# Patient Record
Sex: Male | Born: 1980 | Race: Black or African American | Hispanic: No | Marital: Single | Smoking: Never smoker
Health system: Southern US, Community
[De-identification: ages and names within clinical notes are randomized; demographics above are authoritative.]

## PROBLEM LIST (undated history)

## (undated) DIAGNOSIS — I10 Essential (primary) hypertension: Secondary | ICD-10-CM

## (undated) HISTORY — DX: Essential (primary) hypertension: I10

## (undated) HISTORY — PX: NO PAST SURGERIES: SHX2092

---

## 2002-12-18 ENCOUNTER — Emergency Department (HOSPITAL_COMMUNITY): Admission: EM | Admit: 2002-12-18 | Discharge: 2002-12-18 | Payer: Self-pay | Admitting: Emergency Medicine

## 2002-12-18 ENCOUNTER — Encounter: Payer: Self-pay | Admitting: Emergency Medicine

## 2004-05-12 ENCOUNTER — Emergency Department (HOSPITAL_COMMUNITY): Admission: EM | Admit: 2004-05-12 | Discharge: 2004-05-13 | Payer: Self-pay | Admitting: Emergency Medicine

## 2004-11-07 ENCOUNTER — Emergency Department (HOSPITAL_COMMUNITY): Admission: EM | Admit: 2004-11-07 | Discharge: 2004-11-07 | Payer: Self-pay | Admitting: Emergency Medicine

## 2004-11-10 ENCOUNTER — Emergency Department (HOSPITAL_COMMUNITY): Admission: EM | Admit: 2004-11-10 | Discharge: 2004-11-10 | Payer: Self-pay | Admitting: Emergency Medicine

## 2005-01-12 ENCOUNTER — Emergency Department (HOSPITAL_COMMUNITY): Admission: EM | Admit: 2005-01-12 | Discharge: 2005-01-12 | Payer: Self-pay | Admitting: Emergency Medicine

## 2005-01-31 ENCOUNTER — Emergency Department (HOSPITAL_COMMUNITY): Admission: EM | Admit: 2005-01-31 | Discharge: 2005-01-31 | Payer: Self-pay | Admitting: Emergency Medicine

## 2005-06-05 ENCOUNTER — Emergency Department (HOSPITAL_COMMUNITY): Admission: EM | Admit: 2005-06-05 | Discharge: 2005-06-05 | Payer: Self-pay | Admitting: Emergency Medicine

## 2008-11-17 ENCOUNTER — Ambulatory Visit: Payer: Self-pay | Admitting: Cardiology

## 2008-11-18 ENCOUNTER — Inpatient Hospital Stay (HOSPITAL_COMMUNITY): Admission: EM | Admit: 2008-11-18 | Discharge: 2008-11-19 | Payer: Self-pay | Admitting: Emergency Medicine

## 2008-11-18 ENCOUNTER — Encounter: Payer: Self-pay | Admitting: Cardiovascular Disease

## 2008-11-19 ENCOUNTER — Encounter: Payer: Self-pay | Admitting: Cardiology

## 2008-11-20 ENCOUNTER — Ambulatory Visit: Payer: Self-pay | Admitting: Cardiovascular Disease

## 2008-12-22 ENCOUNTER — Ambulatory Visit: Payer: Self-pay | Admitting: Cardiovascular Disease

## 2009-01-07 ENCOUNTER — Ambulatory Visit: Payer: Self-pay | Admitting: Internal Medicine

## 2009-01-18 DIAGNOSIS — I498 Other specified cardiac arrhythmias: Secondary | ICD-10-CM | POA: Insufficient documentation

## 2009-06-07 ENCOUNTER — Emergency Department (HOSPITAL_COMMUNITY): Admission: EM | Admit: 2009-06-07 | Discharge: 2009-06-07 | Payer: Self-pay | Admitting: Emergency Medicine

## 2009-08-23 ENCOUNTER — Encounter (INDEPENDENT_AMBULATORY_CARE_PROVIDER_SITE_OTHER): Payer: Self-pay | Admitting: *Deleted

## 2010-04-14 ENCOUNTER — Emergency Department (HOSPITAL_COMMUNITY): Admission: EM | Admit: 2010-04-14 | Discharge: 2010-04-14 | Payer: Self-pay | Admitting: Emergency Medicine

## 2010-11-17 ENCOUNTER — Emergency Department (HOSPITAL_COMMUNITY): Admission: EM | Admit: 2010-11-17 | Discharge: 2009-12-26 | Payer: Self-pay | Admitting: Emergency Medicine

## 2011-02-26 LAB — DIFFERENTIAL
Basophils Absolute: 0 10*3/uL (ref 0.0–0.1)
Eosinophils Absolute: 0.2 10*3/uL (ref 0.0–0.7)
Eosinophils Relative: 5 % (ref 0–5)
Lymphs Abs: 1.7 10*3/uL (ref 0.7–4.0)
Neutro Abs: 1.6 10*3/uL — ABNORMAL LOW (ref 1.7–7.7)

## 2011-02-26 LAB — POCT CARDIAC MARKERS
CKMB, poc: <1 ng/mL — ABNORMAL LOW (ref 1.0–8.0)
Troponin i, poc: <0.05 ng/mL (ref 0.00–0.09)

## 2011-02-26 LAB — CBC
HCT: 41.5 % (ref 39.0–52.0)
Hemoglobin: 13.6 g/dL (ref 13.0–17.0)
MCV: 86.4 fL (ref 78.0–100.0)

## 2011-03-13 ENCOUNTER — Emergency Department (HOSPITAL_COMMUNITY)
Admission: EM | Admit: 2011-03-13 | Discharge: 2011-03-13 | Disposition: A | Discharge status: Home / Self Care | Payer: Self-pay | Attending: Emergency Medicine | Admitting: Emergency Medicine

## 2011-03-13 DIAGNOSIS — W261XXA Contact with sword or dagger, initial encounter: Secondary | ICD-10-CM | POA: Insufficient documentation

## 2011-03-13 DIAGNOSIS — Y929 Unspecified place or not applicable: Secondary | ICD-10-CM | POA: Insufficient documentation

## 2011-03-13 DIAGNOSIS — W260XXA Contact with knife, initial encounter: Secondary | ICD-10-CM | POA: Insufficient documentation

## 2011-03-13 DIAGNOSIS — S61209A Unspecified open wound of unspecified finger without damage to nail, initial encounter: Secondary | ICD-10-CM | POA: Insufficient documentation

## 2011-03-13 DIAGNOSIS — I498 Other specified cardiac arrhythmias: Secondary | ICD-10-CM | POA: Insufficient documentation

## 2011-03-13 DIAGNOSIS — R42 Dizziness and giddiness: Secondary | ICD-10-CM | POA: Insufficient documentation

## 2011-04-25 NOTE — Letter (Signed)
January 07, 2009    [name] [name] Erickson. Ronald Seltzer, MD  [address]  [place], [place] [address]   RE:  [name], [name] Erickson  MRN:  [id number]  /  DOB:  February 28, 1981   Dear [name] [name] Erickson,   It was my pleasure to see your patient, [name] [name] Erickson in  electrophysiology consultation today regarding symptomatic bradycardia.  As you recall, [name] [name] Erickson is a very pleasant 30 year old African American  gentleman with longstanding symptoms of fatigue.  He presented to [facility] in early December with symptoms of presyncope and chest  pain.  While on telemetry, he was noted to have sinus bradycardia with  heart rates as low as 37 beats per minute.  Exercise treadmill was  performed, which revealed heart rates up to 150 beats per minute with  normal perfusion imaging.  An echocardiogram revealed normal left  ventricular size and function.  A event monitor was placed December 11,  through December 10, 2008, which revealed a blunted heart rate response  to activities.  His average heart rate was 59 beats per minute with a  heart rate range of 35-125 beats per minute.  The majority of his heart  rates were between 50 and 75 beats per minute during symptoms of  headache and dizziness.  On December 23, at 6:21 p.m., he was found to  have a heart rate of 37 beats per minute.   The patient reports longstanding symptoms of fatigue, which have been  present for years.  He reports that these symptoms have increased in  their significance over the past year.  Presently, he reports becoming  washed out with moderate activities and finds himself having to lie  Erickson.  He reports that by the time he has his kids ready for school and  has done his morning activities that he is completely exhausted by 9  a.m.  He reports occasional lightheadedness as well as frontal headaches  approximately one time per week.  He also notes occasional orthostatic  dizziness.  The patient reports having a workup obtained at [facility] several  months ago, at which time, pacemaker implantation was considered.  He  has not followed up with [facility] since his recent hospitalization at [facility]  He denies presyncope or syncope and is otherwise without  complaint today.   PAST MEDICAL HISTORY:  1. Chronic fatigue.  2. Symptomatic bradycardia (as above).  3. Hypertension.   SURGERIES:  None.   SOCIAL HISTORY:  The patient lives in [place] with his spouse and two  children.  He is currently unemployed due to difficulties maintaining  activities due to fatigue.  He denies tobacco, alcohol, or drug use.   FAMILY HISTORY:  The patient is adopted.  He recently contacted his  mother who reports that she has heart problems .  He is unaware of any  other significant family history.   REVIEW OF SYSTEMS:  All systems are reviewed and negative except as  outlined in the HPI above.   ALLERGIES:  No known drug allergies.   CURRENT MEDICATIONS:  Lisinopril 5 mg daily.   PHYSICAL EXAMINATION:  VITALS:  Blood pressure 120/70, heart rate 48,  respirations 18, weight 163 pounds.  Six-minute walk was performed with  an increase in heart rate to 90 beats per minute.  GENERAL:  The patient is a thin well-appearing gentleman in no acute  distress.  He is alert and oriented x3.  HEENT:  Normocephalic, atraumatic.  Sclerae clear.  Conjunctivae  pink.  Oropharynx clear.  NECK:  Supple.  No thyromegaly, JVD, lymphadenopathy, or bruits.  LUNGS:  Clear to auscultation bilaterally.  HEART:  Regular rate and rhythm.  No murmurs, rubs, or gallops.  GASTROINTESTINAL:  Soft, nontender, nondistended.  Positive bowel  sounds.  EXTREMITIES:  No clubbing, cyanosis or edema.  NEUROLOGIC:  Cranial nerves II through XII are intact.  Strength and  sensation are intact.  SKIN:  No ecchymosis or lacerations.  MUSCULOSKELETAL:  No deformity or atrophy.  PSYCHIATRIC:  Euthymic mood, full affect.   EKG from December 10, reveals sinus rhythm with a  first-degree AV block  and a PR duration of 204 milliseconds.  LVH is also noted.   Transthoracic echocardiogram December 9, reveals a left ventricular end-  diastolic dimension of 47, IVS 66.4, posterior wall 11.4, LAD 34.  The  right and left ventricle were normal in size and function.  There was no  significant valvular disease.   GXT Myoview November 19, 2008, reveals an ejection fraction of 59% with  no evidence of ischemia.   LABORATORY DATA:  TSH 0.803.   IMPRESSION:  [name] [name] Erickson is a pleasant 30 year old gentleman with chronic  fatigue and symptomatic bradycardia who presents today for EP  consultation regarding therapeutic strategies for his symptomatic  bradycardia.  The patient's bradycardia may be secondary to sinus node  dysfunction or the blunted heart rate response due to vagal innervations  of the heart.  I think that a reasonable approach at reversible causes  of symptomatic bradycardia has been performed already.  His thyroid  profile is normal.  I think that we shall obtain a cosyntropin stim test  to rule out adrenal insufficiency, though this would seem unlikely in  the setting of hypertension.  He is on no medications which might be  contributing to his bradycardia.  We will attempt to obtain records from  [facility] where the patient was recently evaluated and pacemaker implantation  was considered.  I would like to defer permanent pacing as a last resort  strategy though this may become necessary as the patient is quite  symptomatic with fatigue and decreased exercise tolerance.  At the  present time, we will start the patient on aminophylline 100 mg twice  daily to promote tachycardia.  If he is unable to tolerate this  medication or has further symptomatic bradycardia, then we might also  consider cilostazol 50 mg twice daily.   PLAN:  1. Obtain records from [facility].  2. Aminophylline 100 mg twice daily.  3. Cosyntropin stimulation test.  4. Followup with me in  clinic in 3 weeks to assess for improvement      with aminophylline.   [name] [name] Erickson, thank you for the opportunity of participating in the care of Mr.  [name] Erickson.  Please feel free to contact me if you wish to discuss his care  further.    Sincerely,      Hillis Range, MD  Electronically Signed   JA/MedQ  DD: 01/07/2009  DT: 01/08/2009  Job #: [id number]

## 2011-04-25 NOTE — Discharge Summary (Signed)
[name], [name] NO.:  [id number]   MEDICAL RECORD NO.:  [id number]          PATIENT TYPE:  INP   LOCATION:  1408                         FACILITY:  [facility]   PHYSICIAN:  Veverly Fells. Excell Seltzer, MD  DATE OF BIRTH:  1981/04/29   DATE OF ADMISSION:  11/17/2008  DATE OF DISCHARGE:  11/19/2008                               DISCHARGE SUMMARY   PRIMARY CARDIOLOGIST:  Veverly Fells. Excell Seltzer, MD   DISCHARGE DIAGNOSIS:  Chest pain.   SECONDARY DIAGNOSES:  1. Asymptomatic bradycardia.  2. Hypertension   ALLERGIES:  No known drug allergies.   PROCEDURES:  Exercise Myoview stress testing revealing EF 59% without  any evidence of ischemia.   HISTORY OF PRESENT ILLNESS:  A 30 year old [place] American male with  prior history of bradycardia and hypertension, previously followed in  [place].  He was in his usual state of health on the evening of  December 9, when he had sudden onset of substernal chest pressure with  the sensation of heart thumping in his chest associated with dyspnea.  He presented to the [facility] ED where his pain was found to be worse  with inspiration.  He was hypokalemic in the ED with a potassium of 3.3  and this was supplemented.  His EKG showed sinus bradycardia at a rate  of 37 beats per minute with LVH, but no acute ST-T changes.  He was  admitted for further evaluation.   HOSPITAL COURSE:  The patient ruled out for MI.  A 2-D echocardiogram  was performed on December 9, showing an EF of 60% without regional wall  motion abnormalities.  He then underwent exercise Myoview this morning  showing an EF of 59% without any evidence of ischemia.  The patient has  had no recurrent chest discomfort.  On the monitor, his heart rates were  noted to be in the 30s during the morning hours, however, the rates  would slowly increased throughout the day into the 70s and 80s.  He did  show good chronotropic competence during the exercise portion of his  stress test and was able to achieve his goal heart rate.  We have  arranged for him to pick up a 30-day event recorder from our office  tomorrow morning, December 11, at 9:00 a.m..  The patient is being  discharged home today in good condition.   DISCHARGE LABORATORY DATA:  Hemoglobin 15.0, hematocrit 44.0, WBC 5.3,  and platelets 173.  Sodium 140, potassium 3.9, chloride 104, CO2 of 30,  BUN 11, creatinine 0.90, glucose 90, calcium 9.3, CK 122, MB 1.0, and  troponin I 0.01.  TSH 0.803.   DISPOSITION:  The patient is being discharged home today in good  condition.   FOLLOWUP PLANS AND APPOINTMENTS:  As noted above.  He will pick up a 30-  day event recorder from our office on December 11, at 9 a.m.  He is to  follow up with Dr. Tonny Bollman on December 28, 2008, at 12:15 p.m.   DISCHARGE MEDICATIONS:  Lisinopril 5 mg daily.   OUTSTANDING LABORATORY STUDIES:  None.  Duration of discharge encounter was 40 minutes including physician time.      Nicolasa Ducking, ANP      Veverly Fells. Excell Seltzer, MD  Electronically Signed    CB/MEDQ  D:  11/19/2008  T:  11/20/2008  Job:  [id number]

## 2011-04-25 NOTE — Assessment & Plan Note (Signed)
[facility]                            CARDIOLOGY OFFICE NOTE   NAME:[name], [name]                         MRN:          [id number]  DATE:12/22/2008                            DOB:          [birth year]    REASON FOR VISIT:  Bradycardia.   HISTORY OF PRESENT ILLNESS:  [name] is a very nice 30 year old  African American gentleman who was hospitalized in December with  symptomatic bradycardia.  [name] presented with chest pain and  heart palpitations associated with dyspnea.  He complained of  generalized fatigue.  His chest pain was highly atypical.  We were  consulted for evaluation of chest pain and bradycardia.  When I  initially saw him in consultation, his resting heart rate was  approximately 40 beats per minute in sinus rhythm.  I arranged for him  to go from [place] to [facility] following day for an exercise  treadmill stress test to assess for chronotropic incompetence.  However,  Ronald following morning, his resting heart rate had changed to  approximately 80 beats per minute.  He underwent an exercise treadmill  study that was unremarkable.  His heart rate response was normal and he  actually underwent a perfusion imaging that was also normal.  Ronald  Erickson had an echo done that showed normal left ventricular size and  systolic function.  There was no evidence of structural heart disease.  He underwent event recording, which showed that most of Ronald time his  heart rate was between 50 and 75 beats per minute; however, there were  episodes of marked sinus bradycardia similar to that seen in Ronald  hospital.   An evaluation today, [name] continues to complain of profound  fatigue.  He denies further chest pain or dyspnea.  He has lightheaded  spells that occur with standing.  He denies frank syncope.  He denies  edema.  He continues to have occasional palpitations.   MEDICATIONS:  Lisinopril 5 mg daily.   ALLERGIES:   NKDA.   PHYSICAL EXAMINATION:  GENERAL:  [name] is alert and oriented.  He  is a thin, healthy-appearing African American gentleman.  VITAL SIGNS:  Weight is 164 pounds, blood pressure 130/70, heart rate is  44, and respiratory rate is 12.  HEENT:  Normal.  NECK:  Normal carotid upstrokes.  No bruits.  JVP is normal.  LUNGS:  Clear.  HEART:  Shows normal apical impulse.  Heart sounds are bradycardic and  regular without murmurs or gallops.  ABDOMEN:  Soft, thin, and nontender.  No organomegaly.  EXTREMITIES:  No clubbing, cyanosis, or edema.  Peripheral pulses are  intact and equal.   ASSESSMENT:  There is a 30 year old gentleman with periodic sinus  bradycardia.  It is difficult to know how much of a role his bradycardia  is playing in his generalized fatigue.  He clearly is symptomatic with  fatigue and episodic lightheadedness.  I have some concerns about  placing a pacemaker in such a young gentleman.  However, I think it is  certainly worthwhile to have an  EP evaluation, and he will be referred  for consideration of permanent pacing.  Further plans pending that  evaluation.  There were no changes made in his medical regimen today.     Veverly Fells. Excell Seltzer, MD  Electronically Signed    MDC/MedQ  DD: 12/22/2008  DT: 12/23/2008  Job #: [id number]

## 2011-08-14 IMAGING — CR DG CHEST 2V
2 series · 2 of 2 positions shown · non-contrast
Comparison: 11/17/2008.

CLINICAL DATA: 29-year-old male with chest pain, hypertension.

CHEST - 2 VIEW

[w chest pa]
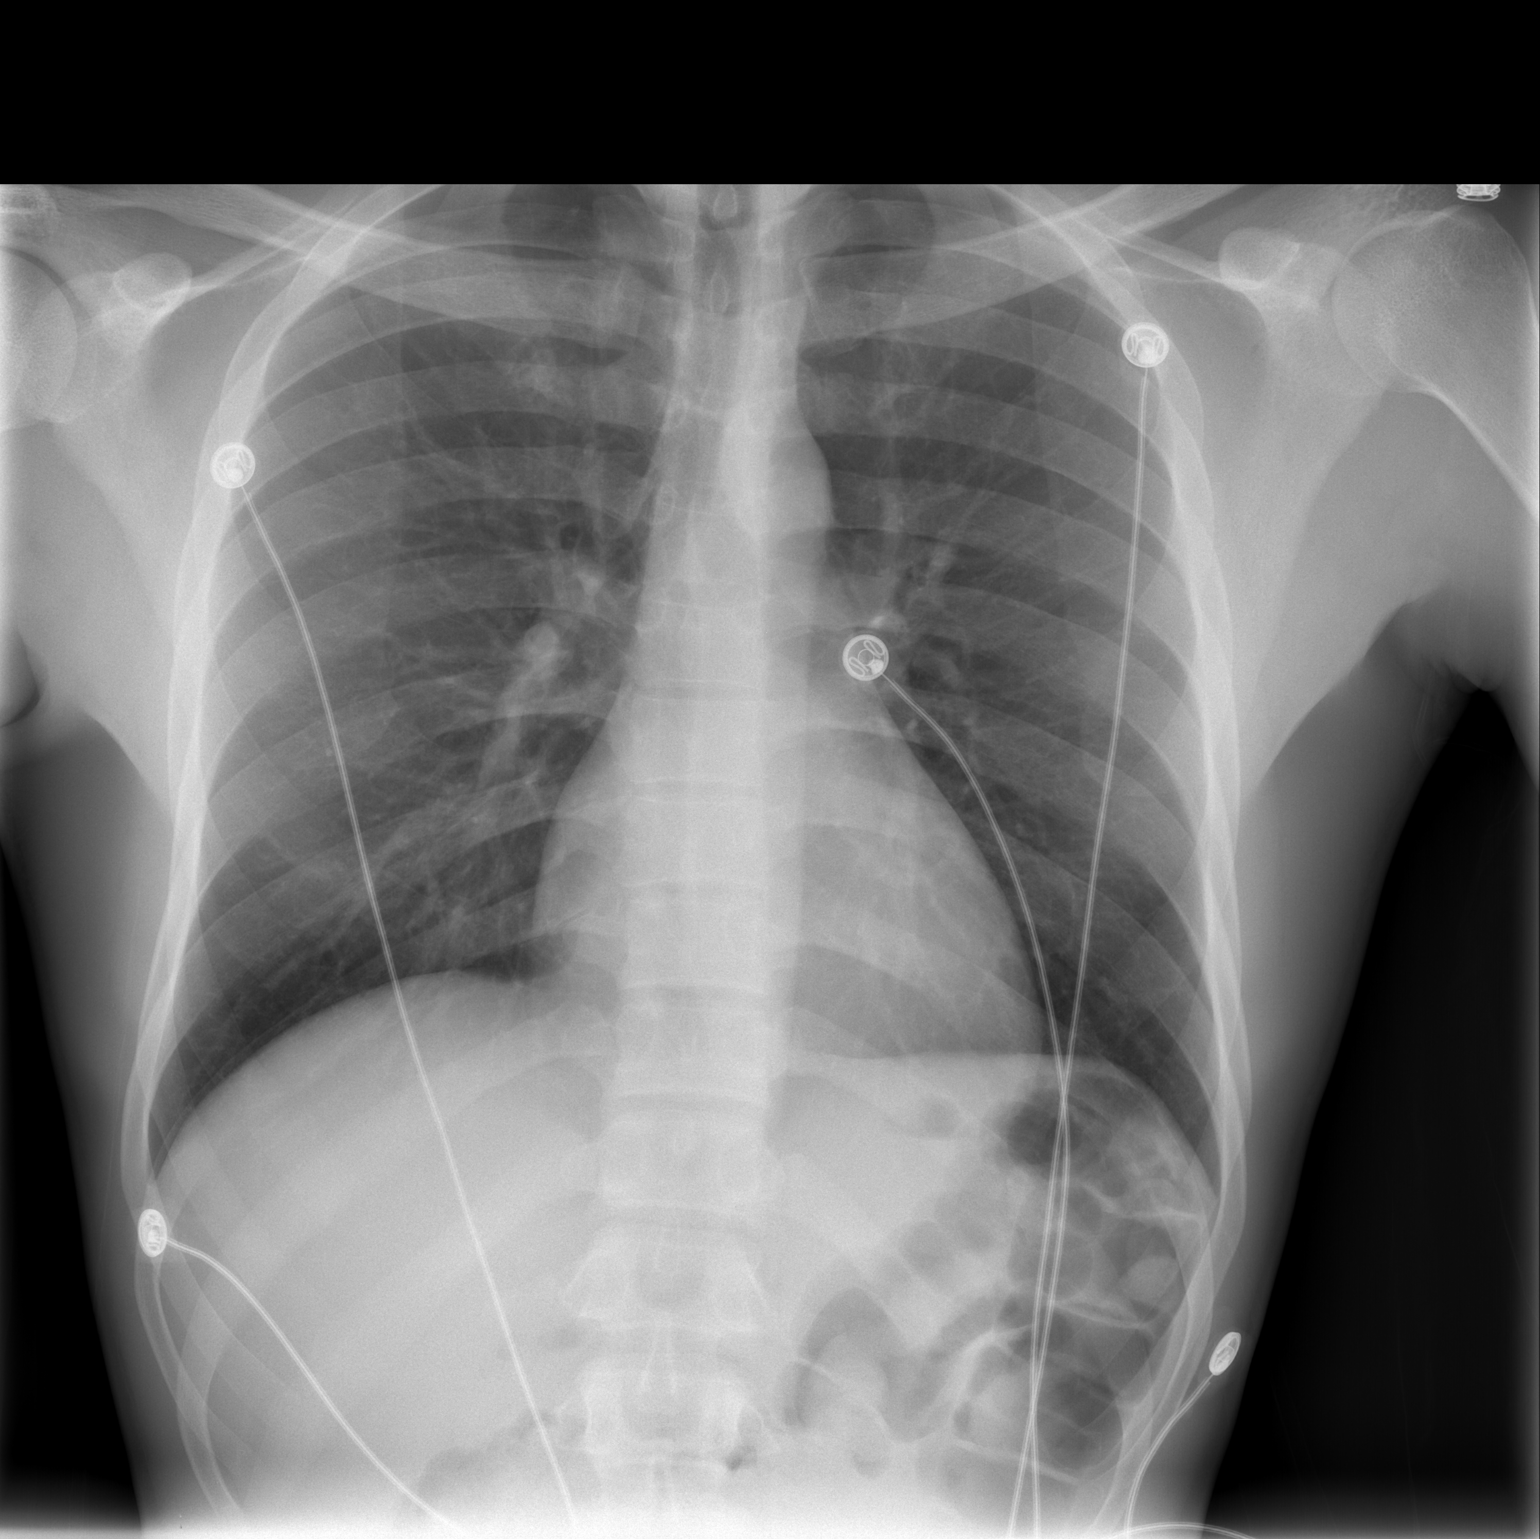

[w chest lat]
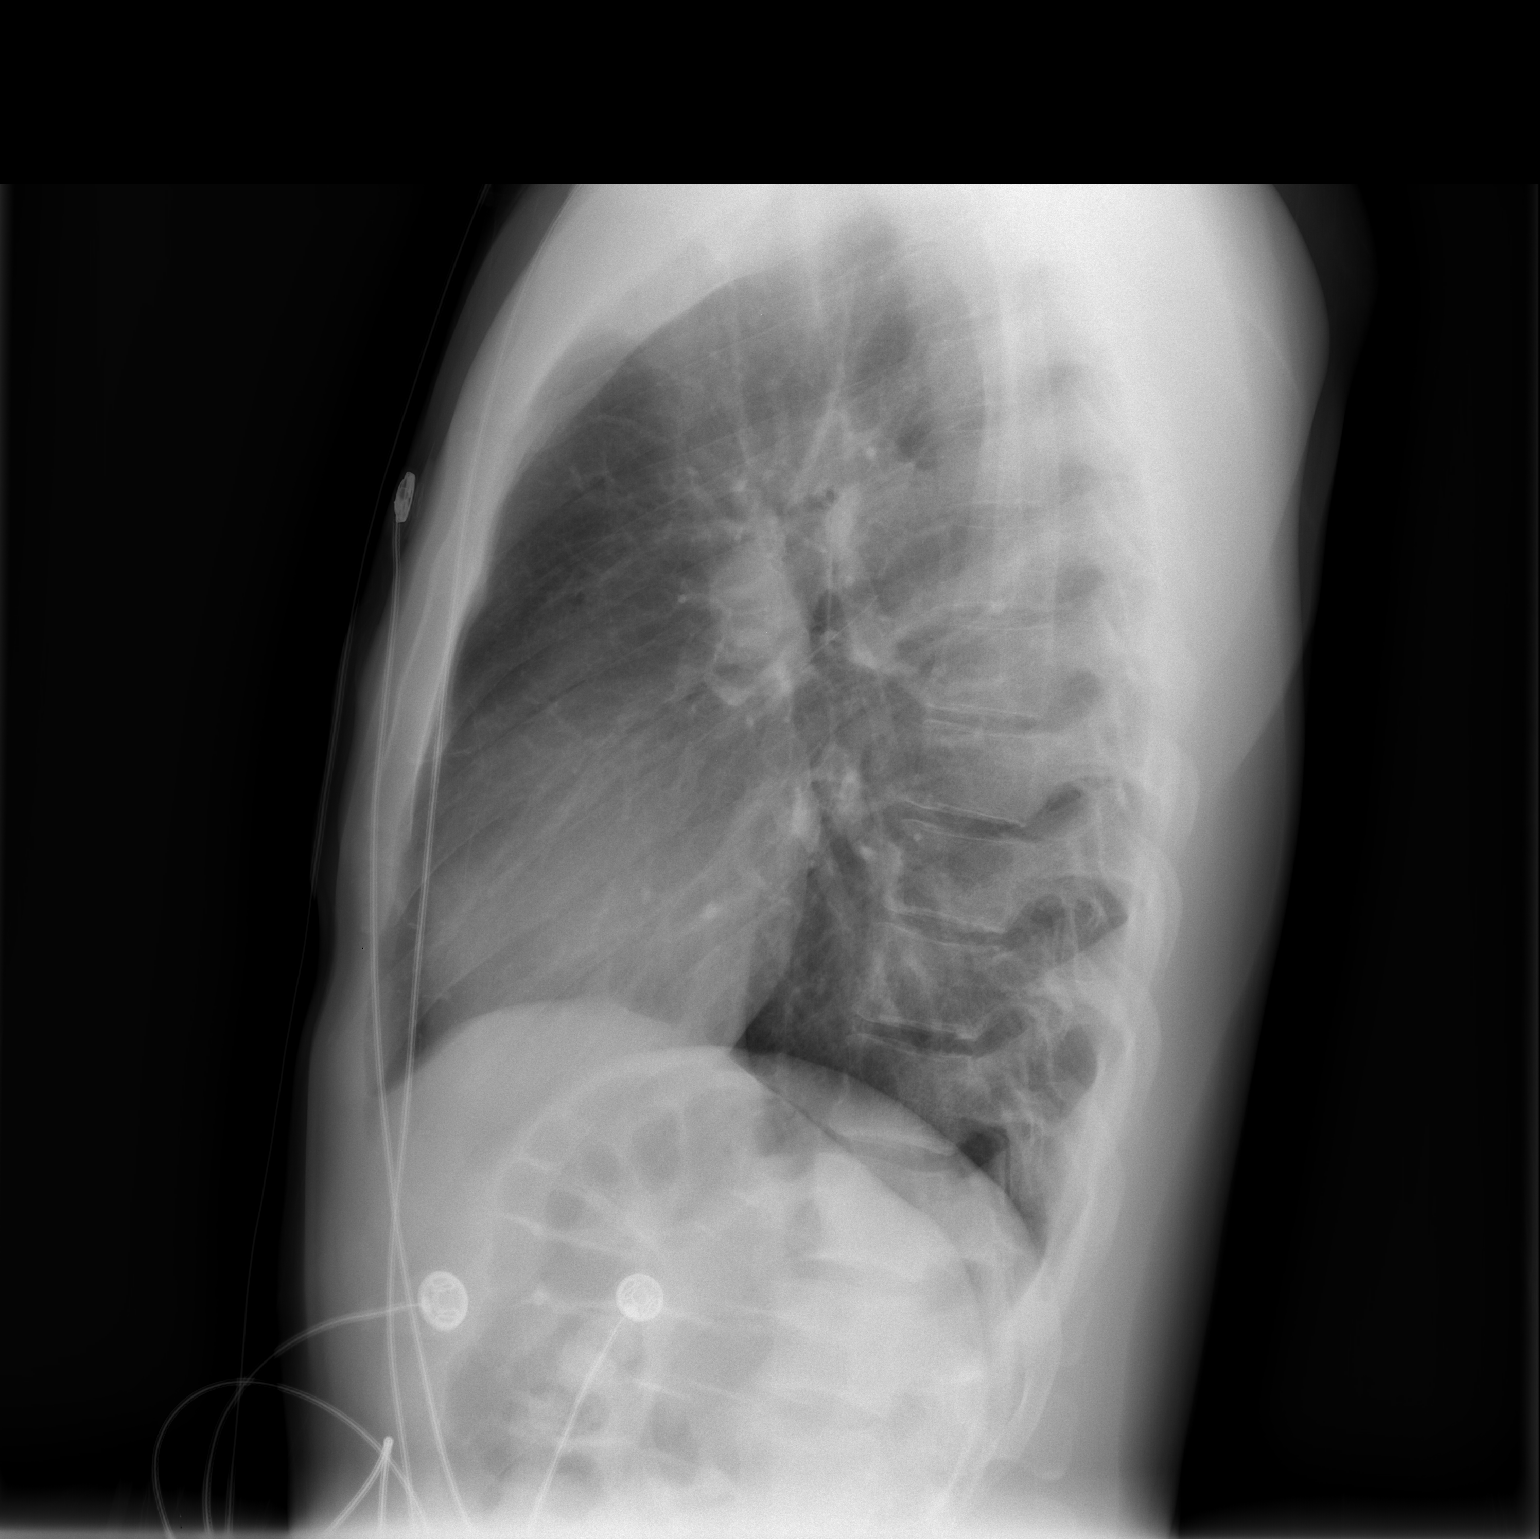

[2 of 2 positions shown; findings below may reference images not displayed]

FINDINGS: Lung volumes are normal.  Cardiac size and mediastinal
contours are within normal limits.  Visualized tracheal air column
is within normal limits.  The lungs remain clear.  No pneumothorax
or effusion. No acute osseous abnormality identified.
IMPRESSION: No acute cardiopulmonary abnormality.

## 2011-09-15 LAB — TSH: TSH: 0.803 u[IU]/mL (ref 0.350–4.500)

## 2011-09-15 LAB — POCT I-STAT, CHEM 8
BUN: 14 mg/dL (ref 6–23)
Creatinine, Ser: 1 mg/dL (ref 0.4–1.5)
Glucose, Bld: 93 mg/dL (ref 70–99)
TCO2: 27 mmol/L (ref 0–100)

## 2011-09-15 LAB — POCT CARDIAC MARKERS
CKMB, poc: <1 ng/mL — ABNORMAL LOW (ref 1.0–8.0)
Myoglobin, poc: 32.8 ng/mL (ref 12–200)
Troponin i, poc: <0.05 ng/mL (ref 0.00–0.09)
Troponin i, poc: <0.05 ng/mL (ref 0.00–0.09)

## 2011-09-15 LAB — CARDIAC PANEL(CRET KIN+CKTOT+MB+TROPI)
CK, MB: 0.8 ng/mL (ref 0.3–4.0)
Relative Index: 0.5 (ref 0.0–2.5)
Total CK: 121 U/L (ref 7–232)
Total CK: 148 U/L (ref 7–232)
Troponin I: 0.02 ng/mL (ref 0.00–0.06)

## 2011-09-15 LAB — BASIC METABOLIC PANEL
CO2: 30 mEq/L (ref 19–32)
Chloride: 104 mEq/L (ref 96–112)

## 2011-09-15 LAB — DIFFERENTIAL
Eosinophils Absolute: 0.3 10*3/uL (ref 0.0–0.7)
Monocytes Absolute: 0.5 10*3/uL (ref 0.1–1.0)
Neutro Abs: 2.9 10*3/uL (ref 1.7–7.7)

## 2011-09-15 LAB — CBC
HCT: 44.2 % (ref 39.0–52.0)
Hemoglobin: 14.2 g/dL (ref 13.0–17.0)
MCHC: 32.1 g/dL (ref 30.0–36.0)
WBC: 5.3 10*3/uL (ref 4.0–10.5)
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
## 2015-03-04 ENCOUNTER — Emergency Department (HOSPITAL_COMMUNITY)
Admission: EM | Admit: 2015-03-04 | Discharge: 2015-03-05 | Disposition: A | Discharge status: Home / Self Care | Payer: Self-pay | Attending: Emergency Medicine | Admitting: Emergency Medicine

## 2015-03-04 ENCOUNTER — Encounter (HOSPITAL_COMMUNITY): Payer: Self-pay | Admitting: *Deleted

## 2015-03-04 DIAGNOSIS — S161XXA Strain of muscle, fascia and tendon at neck level, initial encounter: Secondary | ICD-10-CM

## 2015-03-04 DIAGNOSIS — Y9389 Activity, other specified: Secondary | ICD-10-CM | POA: Insufficient documentation

## 2015-03-04 DIAGNOSIS — S46811A Strain of other muscles, fascia and tendons at shoulder and upper arm level, right arm, initial encounter: Secondary | ICD-10-CM | POA: Insufficient documentation

## 2015-03-04 DIAGNOSIS — Y998 Other external cause status: Secondary | ICD-10-CM | POA: Insufficient documentation

## 2015-03-04 DIAGNOSIS — X58XXXA Exposure to other specified factors, initial encounter: Secondary | ICD-10-CM | POA: Insufficient documentation

## 2015-03-04 DIAGNOSIS — Y9289 Other specified places as the place of occurrence of the external cause: Secondary | ICD-10-CM | POA: Insufficient documentation

## 2015-03-04 NOTE — ED Notes (Signed)
Pt reports R neck pain.  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

## 2015-03-04 NOTE — ED Provider Notes (Signed)
CSN: [id number]     Arrival date & time 03/04/15  2315 History  This chart was scribed for non-physician practitioner, Emilia BeckKaitlyn Trinka Keshishyan, PA-C working with April Palumbo, MD by Gwenyth Oberatherine Macek, ED scribe. This patient was seen in room WTR5/WTR5 and the patient's care was started at 11:59 PM   Chief Complaint  Patient presents with  . Neck Injury   The history is provided by the patient. No language interpreter was used.    HPI Comments: [name] is a 34 y.o. male who presents to the Emergency Department complaining of constant, gradually worsening, moderate right-sided neck pain that started 17 hours ago. 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 His symptoms have become worse throughout the day. Pt did not try any treatment for his symptoms PTA. He denies back pain.   History reviewed. No pertinent past medical history. History reviewed. No pertinent past surgical history. No family history on file. History  Substance Use Topics  . Smoking status: Never Smoker   . Smokeless tobacco: Not on file  . Alcohol Use: No    Review of Systems  Musculoskeletal: Positive for neck pain. Negative for back pain.  Skin: Negative for wound.  All other systems reviewed and are negative.  Allergies  Review of patient's allergies indicates no known allergies.  Home Medications   Prior to Admission medications   Not on File   BP 145/79 mmHg  Pulse 70  Temp(Src) 98.4 F (36.9 C) (Oral)  Resp 17  SpO2 99% Physical Exam  Constitutional: He appears well-developed and well-nourished. No distress.  HENT:  Head: Normocephalic and atraumatic.  Eyes: Conjunctivae and EOM are normal.  Neck: Neck supple. No tracheal deviation present.  Limited ROM due to pain  Cardiovascular: Normal rate and regular rhythm.  Exam reveals no gallop and no friction rub.   No murmur heard. Pulmonary/Chest: Effort normal and breath sounds normal. No respiratory distress. He has no  wheezes. He has no rales. He exhibits no tenderness.  Abdominal: Soft. There is no tenderness.  Musculoskeletal: Normal range of motion.  TTP over right SCM; no midline spine TTP  Neurological: He is alert.  Speech is goal-oriented. Moves limbs without ataxia.   Skin: Skin is warm and dry.  Psychiatric: He has a normal mood and affect. His behavior is normal.  Nursing note and vitals reviewed.   ED Course  Procedures   DIAGNOSTIC STUDIES: Oxygen Saturation is 99% on RA, normal by my interpretation.    COORDINATION OF CARE: 12:02 AM Discussed treatment plan with pt at bedside and pt agreed to plan.  Labs Review Labs Reviewed - No data to display  Imaging Review No results found.   EKG Interpretation None      MDM   Final diagnoses:  Strain of sternocleidomastoid muscle, initial encounter    Patient likely has a muscle strain and will be treated with flexeril and vicodin. No imaging needed at this time.   I personally performed the services described in this documentation, which was scribed in my presence. The recorded information has been reviewed and is accurate.    Emilia BeckKaitlyn Rosanne Wohlfarth, PA-C 03/05/15 [phone]  April Palumbo, MD 03/05/15 [phone]

## 2015-03-05 MED ORDER — CYCLOBENZAPRINE HCL 10 MG PO TABS: 10.0000 mg | ORAL_TABLET | Freq: Two times a day (BID) | ORAL | Status: DC | PRN

## 2015-03-05 MED ORDER — KETOROLAC TROMETHAMINE 60 MG/2ML IM SOLN
60.0000 mg | Freq: Once | INTRAMUSCULAR | Status: AC
  Administered 2015-03-05: 60 mg via INTRAMUSCULAR
  Filled 2015-03-05: qty 2

## 2015-03-05 MED ORDER — HYDROCODONE-ACETAMINOPHEN 5-325 MG PO TABS: 2.0000 | ORAL_TABLET | ORAL | Status: DC | PRN

## 2015-03-05 NOTE — Discharge Instructions (Signed)
Take Vicodin as needed for pain. Take Flexeril as needed for muscle spasm. You may take these medications together. Refer to attached documents for more information.  °

## 2016-02-19 ENCOUNTER — Encounter (HOSPITAL_COMMUNITY): Payer: Self-pay | Admitting: *Deleted

## 2016-02-19 ENCOUNTER — Emergency Department (HOSPITAL_COMMUNITY)
Admission: EM | Admit: 2016-02-19 | Discharge: 2016-02-19 | Disposition: A | Discharge status: Home / Self Care | Payer: Self-pay | Attending: Emergency Medicine | Admitting: Emergency Medicine

## 2016-02-19 DIAGNOSIS — J069 Acute upper respiratory infection, unspecified: Secondary | ICD-10-CM | POA: Insufficient documentation

## 2016-02-19 DIAGNOSIS — R42 Dizziness and giddiness: Secondary | ICD-10-CM | POA: Insufficient documentation

## 2016-02-19 DIAGNOSIS — R51 Headache: Secondary | ICD-10-CM

## 2016-02-19 DIAGNOSIS — R519 Headache, unspecified: Secondary | ICD-10-CM

## 2016-02-19 LAB — CBC WITH DIFFERENTIAL/PLATELET
BASOS ABS: 0 10*3/uL (ref 0.0–0.1)
BASOS PCT: 0 %
EOS ABS: 0 10*3/uL (ref 0.0–0.7)
Eosinophils Relative: 0 %
HCT: 42.9 % (ref 39.0–52.0)
HEMOGLOBIN: 14.8 g/dL (ref 13.0–17.0)
LYMPHS ABS: 0.8 10*3/uL (ref 0.7–4.0)
Lymphocytes Relative: 9 %
MCH: 28.1 pg (ref 26.0–34.0)
MCHC: 34.5 g/dL (ref 30.0–36.0)
MCV: 81.4 fL (ref 78.0–100.0)
Monocytes Absolute: 0.7 10*3/uL (ref 0.1–1.0)
Monocytes Relative: 7 %
NEUTROS PCT: 84 %
Neutro Abs: 7.7 10*3/uL (ref 1.7–7.7)
Platelets: 184 10*3/uL (ref 150–400)
RBC: 5.27 MIL/uL (ref 4.22–5.81)
RDW: 13.2 % (ref 11.5–15.5)
WBC: 9.1 10*3/uL (ref 4.0–10.5)

## 2016-02-19 LAB — BASIC METABOLIC PANEL
ANION GAP: 11 (ref 5–15)
BUN: 6 mg/dL (ref 6–20)
CALCIUM: 8.3 mg/dL — AB (ref 8.9–10.3)
CO2: 24 mmol/L (ref 22–32)
Chloride: 105 mmol/L (ref 101–111)
Creatinine, Ser: 0.99 mg/dL (ref 0.61–1.24)
Glucose, Bld: 99 mg/dL (ref 65–99)
POTASSIUM: 3.2 mmol/L — AB (ref 3.5–5.1)
Sodium: 140 mmol/L (ref 135–145)

## 2016-02-19 LAB — RAPID STREP SCREEN (MED CTR MEBANE ONLY): STREPTOCOCCUS, GROUP A SCREEN (DIRECT): NEGATIVE

## 2016-02-19 MED ORDER — METOCLOPRAMIDE HCL 5 MG/ML IJ SOLN
10.0000 mg | Freq: Once | INTRAMUSCULAR | Status: AC
  Administered 2016-02-19: 10 mg via INTRAVENOUS
  Filled 2016-02-19: qty 2

## 2016-02-19 MED ORDER — SODIUM CHLORIDE 0.9 % IV BOLUS (SEPSIS)
500.0000 mL | Freq: Once | INTRAVENOUS | Status: AC
  Administered 2016-02-19: 500 mL via INTRAVENOUS

## 2016-02-19 MED ORDER — CETIRIZINE HCL 10 MG PO TABS: 10.0000 mg | ORAL_TABLET | Freq: Every day | ORAL | Status: DC

## 2016-02-19 MED ORDER — KETOROLAC TROMETHAMINE 30 MG/ML IJ SOLN
30.0000 mg | Freq: Once | INTRAMUSCULAR | Status: AC
  Administered 2016-02-19: 30 mg via INTRAVENOUS
  Filled 2016-02-19: qty 1

## 2016-02-19 MED ORDER — DIPHENHYDRAMINE HCL 50 MG/ML IJ SOLN
25.0000 mg | Freq: Once | INTRAMUSCULAR | Status: AC
  Administered 2016-02-19: 25 mg via INTRAVENOUS
  Filled 2016-02-19: qty 1

## 2016-02-19 MED ORDER — SODIUM CHLORIDE 0.9 % IV BOLUS (SEPSIS)
1000.0000 mL | Freq: Once | INTRAVENOUS | Status: AC
  Administered 2016-02-19: 1000 mL via INTRAVENOUS

## 2016-02-19 MED ORDER — NAPROXEN 250 MG PO TABS
250.0000 mg | ORAL_TABLET | Freq: Two times a day (BID) | ORAL | Status: DC
Start: 2016-02-19 — End: 2016-12-08

## 2016-02-19 MED ORDER — ACETAMINOPHEN 325 MG PO TABS
650.0000 mg | ORAL_TABLET | Freq: Once | ORAL | Status: AC
  Administered 2016-02-19: 650 mg via ORAL
  Filled 2016-02-19: qty 2

## 2016-02-19 NOTE — ED Notes (Addendum)
Pt states he has a knot on his head with no known origin that hurts to the touch. This RN does not see any obvious swelling to this area. Pt states he does not remember hitting his head, but the sore spot has been there for about 2 weeks.

## 2016-02-19 NOTE — ED Notes (Signed)
Pt admits to headache x6 months, has been taking BC powder w/o relief - pt w/ dizziness, generalized bodyaches and chills that began last night.
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
## 2016-02-19 NOTE — Discharge Instructions (Signed)
Concussion, Adult A concussion, or closed-head injury, is a brain injury caused by a direct blow to the head or by a quick and sudden movement (jolt) of the head or neck. Concussions are usually not life-threatening. Even so, the effects of a concussion can be serious. If you have had a concussion before, you are more likely to experience concussion-like symptoms after a direct blow to the head.  CAUSES  Direct blow to the head, such as from running into another player during a soccer game, being hit in a fight, or hitting your head on a hard surface.  A jolt of the head or neck that causes the brain to move back and forth inside the skull, such as in a car crash. SIGNS AND SYMPTOMS The signs of a concussion can be hard to notice. Early on, they may be missed by you, family members, and health care providers. You may look fine but act or feel differently. Symptoms are usually temporary, but they may last for days, weeks, or even longer. Some symptoms may appear right away while others may not show up for hours or days. Every head injury is different. Symptoms include:  Mild to moderate headaches that will not go away.  A feeling of pressure inside your head.  Having more trouble than usual:  Learning or remembering things you have heard.  Answering questions.  Paying attention or concentrating.  Organizing daily tasks.  Making decisions and solving problems.  Slowness in thinking, acting or reacting, speaking, or reading.  Getting lost or being easily confused.  Feeling tired all the time or lacking energy (fatigued).  Feeling drowsy.  Sleep disturbances.  Sleeping more than usual.  Sleeping less than usual.  Trouble falling asleep.  Trouble sleeping (insomnia).  Loss of balance or feeling lightheaded or dizzy.  Nausea or vomiting.  Numbness or tingling.  Increased sensitivity to:  Sounds.  Lights.  Distractions.  Vision problems or eyes that tire  easily.  Diminished sense of taste or smell.  Ringing in the ears.  Mood changes such as feeling sad or anxious.  Becoming easily irritated or angry for little or no reason.  Lack of motivation.  Seeing or hearing things other people do not see or hear (hallucinations). DIAGNOSIS Your health care provider can usually diagnose a concussion based on a description of your injury and symptoms. He or she will ask whether you passed out (lost consciousness) and whether you are having trouble remembering events that happened right before and during your injury. Your evaluation might include:  A brain scan to look for signs of injury to the brain. Even if the test shows no injury, you may still have a concussion.  Blood tests to be sure other problems are not present. TREATMENT  Concussions are usually treated in an emergency department, in urgent care, or at a clinic. You may need to stay in the hospital overnight for further treatment.  Tell your health care provider if you are taking any medicines, including prescription medicines, over-the-counter medicines, and natural remedies. Some medicines, such as blood thinners (anticoagulants) and aspirin, may increase the chance of complications. Also tell your health care provider whether you have had alcohol or are taking illegal drugs. This information may affect treatment.  Your health care provider will send you home with important instructions to follow.  How fast you will recover from a concussion depends on many factors. These factors include how severe your concussion is, what part of your brain was injured,  your age, and how healthy you were before the concussion.  Most people with mild injuries recover fully. Recovery can take time. In general, recovery is slower in older persons. Also, persons who have had a concussion in the past or have other medical problems may find that it takes longer to recover from their current injury. HOME  CARE INSTRUCTIONS General Instructions  Carefully follow the directions your health care provider gave you.  Only take over-the-counter or prescription medicines for pain, discomfort, or fever as directed by your health care provider.  Take only those medicines that your health care provider has approved.  Do not drink alcohol until your health care provider says you are well enough to do so. Alcohol and certain other drugs may slow your recovery and can put you at risk of further injury.  If it is harder than usual to remember things, write them down.  If you are easily distracted, try to do one thing at a time. For example, do not try to watch TV while fixing dinner.  Talk with family members or close friends when making important decisions.  Keep all follow-up appointments. Repeated evaluation of your symptoms is recommended for your recovery.  Watch your symptoms and tell others to do the same. Complications sometimes occur after a concussion. Older adults with a brain injury may have a higher risk of serious complications, such as a blood clot on the brain.  Tell your teachers, school nurse, school counselor, coach, athletic trainer, or work Freight forwarder about your injury, symptoms, and restrictions. Tell them about what you can or cannot do. They should watch for:  Increased problems with attention or concentration.  Increased difficulty remembering or learning new information.  Increased time needed to complete tasks or assignments.  Increased irritability or decreased ability to cope with stress.  Increased symptoms.  Rest. Rest helps the brain to heal. Make sure you:  Get plenty of sleep at night. Avoid staying up late at night.  Keep the same bedtime hours on weekends and weekdays.  Rest during the day. Take daytime naps or rest breaks when you feel tired.  Limit activities that require a lot of thought or concentration. These include:  Doing homework or job-related  work.  Watching TV.  Working on the computer.  Avoid any situation where there is potential for another head injury (football, hockey, soccer, basketball, martial arts, downhill snow sports and horseback riding). Your condition will get worse every time you experience a concussion. You should avoid these activities until you are evaluated by the appropriate follow-up health care providers. Returning To Your Regular Activities You will need to return to your normal activities slowly, not all at once. You must give your body and brain enough time for recovery.  Do not return to sports or other athletic activities until your health care provider tells you it is safe to do so.  Ask your health care provider when you can drive, ride a bicycle, or operate heavy machinery. Your ability to react may be slower after a brain injury. Never do these activities if you are dizzy.  Ask your health care provider about when you can return to work or school. Preventing Another Concussion It is very important to avoid another brain injury, especially before you have recovered. In rare cases, another injury can lead to permanent brain damage, brain swelling, or death. The risk of this is greatest during the first 7-10 days after a head injury. Avoid injuries by:  Wearing a  seat belt when riding in a car.  Drinking alcohol only in moderation.  Wearing a helmet when biking, skiing, skateboarding, skating, or doing similar activities.  Avoiding activities that could lead to a second concussion, such as contact or recreational sports, until your health care provider says it is okay.  Taking safety measures in your home.  Remove clutter and tripping hazards from floors and stairways.  Use grab bars in bathrooms and handrails by stairs.  Place non-slip mats on floors and in bathtubs.  Improve lighting in dim areas. SEEK MEDICAL CARE IF:  You have increased problems paying attention or  concentrating.  You have increased difficulty remembering or learning new information.  You need more time to complete tasks or assignments than before.  You have increased irritability or decreased ability to cope with stress.  You have more symptoms than before. Seek medical care if you have any of the following symptoms for more than 2 weeks after your injury:  Lasting (chronic) headaches.  Dizziness or balance problems.  Nausea.  Vision problems.  Increased sensitivity to noise or light.  Depression or mood swings.  Anxiety or irritability.  Memory problems.  Difficulty concentrating or paying attention.  Sleep problems.  Feeling tired all the time. SEEK IMMEDIATE MEDICAL CARE IF:  You have severe or worsening headaches. These may be a sign of a blood clot in the brain.  You have weakness (even if only in one hand, leg, or part of the face).  You have numbness.  You have decreased coordination.  You vomit repeatedly.  You have increased sleepiness.  One pupil is larger than the other.  You have convulsions.  You have slurred speech.  You have increased confusion. This may be a sign of a blood clot in the brain.  You have increased restlessness, agitation, or irritability.  You are unable to recognize people or places.  You have neck pain.  It is difficult to wake you up.  You have unusual behavior changes.  You lose consciousness. MAKE SURE YOU:  Understand these instructions.  Will watch your condition.  Will get help right away if you are not doing well or get worse.   This information is not intended to replace advice given to you by your health care provider. Make sure you discuss any questions you have with your health care provider.   Document Released: 02/17/2004 Document Revised: 12/18/2014 Document Reviewed: 06/19/2013 Elsevier Interactive Patient Education 2016 [employer] General Headache Without Cause A headache is pain  or discomfort felt around the head or neck area. The specific cause of a headache may not be found. There are many causes and types of headaches. A few common ones are:  Tension headaches.  Migraine headaches.  Cluster headaches.  Chronic daily headaches. HOME CARE INSTRUCTIONS  Watch your condition for any changes. Take these steps to help with your condition: Managing Pain  Take over-the-counter and prescription medicines only as told by your health care provider.  Lie down in a dark, quiet room when you have a headache.  If directed, apply ice to the head and neck area:  Put ice in a plastic bag.  Place a towel between your skin and the bag.  Leave the ice on for 20 minutes, 2-3 times per day.  Use a heating pad or hot shower to apply heat to the head and neck area as told by your health care provider.  Keep lights dim if bright lights bother you or make your  headaches worse. Eating and Drinking  Eat meals on a regular schedule.  Limit alcohol use.  Decrease the amount of caffeine you drink, or stop drinking caffeine. General Instructions  Keep all follow-up visits as told by your health care provider. This is important.  Keep a headache journal to help find out what may trigger your headaches. For example, write down:  What you eat and drink.  How much sleep you get.  Any change to your diet or medicines.  Try massage or other relaxation techniques.  Limit stress.  Sit up straight, and do not tense your muscles.  Do not use tobacco products, including cigarettes, chewing tobacco, or e-cigarettes. If you need help quitting, ask your health care provider.  Exercise regularly as told by your health care provider.  Sleep on a regular schedule. Get 7-9 hours of sleep, or the amount recommended by your health care provider. SEEK MEDICAL CARE IF:   Your symptoms are not helped by medicine.  You have a headache that is different from the usual  headache.  You have nausea or you vomit.  You have a fever. SEEK IMMEDIATE MEDICAL CARE IF:   Your headache becomes severe.  You have repeated vomiting.  You have a stiff neck.  You have a loss of vision.  You have problems with speech.  You have pain in the eye or ear.  You have muscular weakness or loss of muscle control.  You lose your balance or have trouble walking.  You feel faint or pass out.  You have confusion.   This information is not intended to replace advice given to you by your health care provider. Make sure you discuss any questions you have with your health care provider.   Document Released: 11/27/2005 Document Revised: 08/18/2015 Document Reviewed: 03/22/2015 Elsevier Interactive Patient Education 2016 [employer] Upper Respiratory Infection, Adult Most upper respiratory infections (URIs) are a viral infection of the air passages leading to the lungs. A URI affects the nose, throat, and upper air passages. The most common type of URI is nasopharyngitis and is typically referred to as "the common cold." URIs run their course and usually go away on their own. Most of the time, a URI does not require medical attention, but sometimes a bacterial infection in the upper airways can follow a viral infection. This is called a secondary infection. Sinus and middle ear infections are common types of secondary upper respiratory infections. Bacterial pneumonia can also complicate a URI. A URI can worsen asthma and chronic obstructive pulmonary disease (COPD). Sometimes, these complications can require emergency medical care and may be life threatening.  CAUSES Almost all URIs are caused by viruses. A virus is a type of germ and can spread from one person to another.  RISKS FACTORS You may be at risk for a URI if:   You smoke.   You have chronic heart or lung disease.  You have a weakened defense (immune) system.   You are very young or very old.   You  have nasal allergies or asthma.  You work in crowded or poorly ventilated areas.  You work in health care facilities or schools. SIGNS AND SYMPTOMS  Symptoms typically develop 2-3 days after you come in contact with a cold virus. Most viral URIs last 7-10 days. However, viral URIs from the influenza virus (flu virus) can last 14-18 days and are typically more severe. Symptoms may include:   Runny or stuffy (congested) nose.   Sneezing.  Cough.   Sore throat.   Headache.   Fatigue.   Fever.   Loss of appetite.   Pain in your forehead, behind your eyes, and over your cheekbones (sinus pain).  Muscle aches.  DIAGNOSIS  Your health care provider may diagnose a URI by:  Physical exam.  Tests to check that your symptoms are not due to another condition such as:  Strep throat.  Sinusitis.  Pneumonia.  Asthma. TREATMENT  A URI goes away on its own with time. It cannot be cured with medicines, but medicines may be prescribed or recommended to relieve symptoms. Medicines may help:  Reduce your fever.  Reduce your cough.  Relieve nasal congestion. HOME CARE INSTRUCTIONS   Take medicines only as directed by your health care provider.   Gargle warm saltwater or take cough drops to comfort your throat as directed by your health care provider.  Use a warm mist humidifier or inhale steam from a shower to increase air moisture. This may make it easier to breathe.  Drink enough fluid to keep your urine clear or pale yellow.   Eat soups and other clear broths and maintain good nutrition.   Rest as needed.   Return to work when your temperature has returned to normal or as your health care provider advises. You may need to stay home longer to avoid infecting others. You can also use a face mask and careful hand washing to prevent spread of the virus.  Increase the usage of your inhaler if you have asthma.   Do not use any tobacco products, including  cigarettes, chewing tobacco, or electronic cigarettes. If you need help quitting, ask your health care provider. PREVENTION  The best way to protect yourself from getting a cold is to practice good hygiene.   Avoid oral or hand contact with people with cold symptoms.   Wash your hands often if contact occurs.  There is no clear evidence that vitamin C, vitamin E, echinacea, or exercise reduces the chance of developing a cold. However, it is always recommended to get plenty of rest, exercise, and practice good nutrition.  SEEK MEDICAL CARE IF:   You are getting worse rather than better.   Your symptoms are not controlled by medicine.   You have chills.  You have worsening shortness of breath.  You have brown or red mucus.  You have yellow or brown nasal discharge.  You have pain in your face, especially when you bend forward.  You have a fever.  You have swollen neck glands.  You have pain while swallowing.  You have Morrical areas in the back of your throat. SEEK IMMEDIATE MEDICAL CARE IF:   You have severe or persistent:  Headache.  Ear pain.  Sinus pain.  Chest pain.  You have chronic lung disease and any of the following:  Wheezing.  Prolonged cough.  Coughing up blood.  A change in your usual mucus.  You have a stiff neck.  You have changes in your:  Vision.  Hearing.  Thinking.  Mood. MAKE SURE YOU:   Understand these instructions.  Will watch your condition.  Will get help right away if you are not doing well or get worse.   This information is not intended to replace advice given to you by your health care provider. Make sure you discuss any questions you have with your health care provider.   Document Released: 05/23/2001 Document Revised: 04/13/2015 Document Reviewed: 03/04/2014 Elsevier Interactive Patient Education [employer]

## 2016-02-22 LAB — CULTURE, GROUP A STREP (THRC)

## 2016-02-23 ENCOUNTER — Encounter: Payer: Self-pay | Admitting: Internal Medicine

## 2016-02-23 ENCOUNTER — Encounter: Payer: Self-pay | Admitting: Clinical

## 2016-02-23 ENCOUNTER — Ambulatory Visit: Payer: Self-pay | Attending: Internal Medicine | Admitting: Internal Medicine

## 2016-02-23 VITALS — BP 143/84 | HR 81 | Temp 97.9°F | Resp 18 | Ht 75.0 in | Wt 184.2 lb

## 2016-02-23 DIAGNOSIS — S0990XD Unspecified injury of head, subsequent encounter: Secondary | ICD-10-CM | POA: Insufficient documentation

## 2016-02-23 DIAGNOSIS — Z23 Encounter for immunization: Secondary | ICD-10-CM | POA: Insufficient documentation

## 2016-02-23 DIAGNOSIS — Z79899 Other long term (current) drug therapy: Secondary | ICD-10-CM | POA: Insufficient documentation

## 2016-02-23 DIAGNOSIS — I10 Essential (primary) hypertension: Secondary | ICD-10-CM

## 2016-02-23 DIAGNOSIS — R51 Headache: Secondary | ICD-10-CM | POA: Insufficient documentation

## 2016-02-23 DIAGNOSIS — T3995XA Adverse effect of unspecified nonopioid analgesic, antipyretic and antirheumatic, initial encounter: Secondary | ICD-10-CM

## 2016-02-23 DIAGNOSIS — Z0000 Encounter for general adult medical examination without abnormal findings: Secondary | ICD-10-CM

## 2016-02-23 DIAGNOSIS — G4440 Drug-induced headache, not elsewhere classified, not intractable: Secondary | ICD-10-CM

## 2016-02-23 MED ORDER — LISINOPRIL 10 MG PO TABS: 10.0000 mg | ORAL_TABLET | Freq: Every day | ORAL | Status: DC

## 2016-02-23 NOTE — Assessment & Plan Note (Signed)
Hx same, remotely on lisinopril but self DC'd same few years ago Given potential for contribution of HTN to HA, will restart ACEI now Lisinopril 10mg  qd - erx done  Education provided Recheck 4 weeks, sooner if problems BP Readings from Last 3 Encounters:  02/23/16 143/84  02/19/16 130/83  03/04/15 145/79

## 2016-02-23 NOTE — Progress Notes (Signed)
Patient is here for ED FU for HA's  Patient complains of HA being present "for awhile". Patient states he takes BC powder and goes to sleep which gives him relief. Patient complains of HA being intermittent yet consistent. Pain is described as presenting as throbbing and turning into pressure. Pain is currently scaled at a 4. Patient also complains of a knot on the top of his cranium. Knot is tender to the touch only.  Patient would like his flu shot today. Patient tolerated the flu shot well.

## 2016-02-23 NOTE — Progress Notes (Signed)
[facility] 02[facility]  Decreased Interest 0  Down, Depressed, Hopeless 0  PHQ - 2 Score 0  Altered sleeping 1  Tired, decreased energy 3  Change in appetite 1  Feeling bad or failure about yourself  0  Trouble concentrating 2  Moving slowly or fidgety/restless 0  Suicidal thoughts 0  PHQ-9 Score 7    GAD 7 : Generalized Anxiety Score 02/23/2016  Nervous, Anxious, on Edge 2  Control/stop worrying 1  Worry too much - different things 1  Trouble relaxing 1  Restless 0  Easily annoyed or irritable 2  Afraid - awful might happen 0  Total GAD 7 Score 7

## 2016-02-23 NOTE — Patient Instructions (Addendum)
It was good to see you today.  We have reviewed your prior records including labs and tests today  Medications reviewed and updated STOP taking BC powders Ok to use occassional naprosyn as per ED instructions START lisinopril for high blood pressure - No other changes recommended at this time.  Your lisinopril prescription has been submitted to your pharmacy. Please take as directed and contact our office if you believe you are having problem(s) with the medication(s).  Please schedule followup in 1 month for blood pressure recheck, call sooner if problems.  Hypertension Hypertension, commonly called high blood pressure, is when the force of blood pumping through your arteries is too strong. Your arteries are the blood vessels that carry blood from your heart throughout your body. A blood pressure reading consists of a higher number over a lower number, such as 110/72. The higher number (systolic) is the pressure inside your arteries when your heart pumps. The lower number (diastolic) is the pressure inside your arteries when your heart relaxes. Ideally you want your blood pressure below 120/80. Hypertension forces your heart to work harder to pump blood. Your arteries may become narrow or stiff. Having untreated or uncontrolled hypertension can cause heart attack, stroke, kidney disease, and other problems. RISK FACTORS Some risk factors for high blood pressure are controllable. Others are not.  Risk factors you cannot control include:   Race. You may be at higher risk if you are African American.  Age. Risk increases with age.  Gender. Men are at higher risk than women before age 35 years. After age 35, women are at higher risk than men. Risk factors you can control include:  Not getting enough exercise or physical activity.  Being overweight.  Getting too much fat, sugar, calories, or salt in your diet.  Drinking too much alcohol. SIGNS AND SYMPTOMS Hypertension does not  usually cause signs or symptoms. Extremely high blood pressure (hypertensive crisis) may cause headache, anxiety, shortness of breath, and nosebleed. DIAGNOSIS To check if you have hypertension, your health care provider will measure your blood pressure while you are seated, with your arm held at the level of your heart. It should be measured at least twice using the same arm. Certain conditions can cause a difference in blood pressure between your right and left arms. A blood pressure reading that is higher than normal on one occasion does not mean that you need treatment. If it is not clear whether you have high blood pressure, you may be asked to return on a different day to have your blood pressure checked again. Or, you may be asked to monitor your blood pressure at home for 1 or more weeks. TREATMENT Treating high blood pressure includes making lifestyle changes and possibly taking medicine. Living a healthy lifestyle can help lower high blood pressure. You may need to change some of your habits. Lifestyle changes may include:  Following the DASH diet. This diet is high in fruits, vegetables, and whole grains. It is low in salt, red meat, and added sugars.  Keep your sodium intake below 2,300 mg per day.  Getting at least 30-45 minutes of aerobic exercise at least 4 times per week.  Losing weight if necessary.  Not smoking.  Limiting alcoholic beverages.  Learning ways to reduce stress. Your health care provider may prescribe medicine if lifestyle changes are not enough to get your blood pressure under control, and if one of the following is true:  You are 5918-35 years of age and your  systolic blood pressure is above 140.  You are 80 years of age or older, and your systolic blood pressure is above 150.  Your diastolic blood pressure is above 90.  You have diabetes, and your systolic blood pressure is over 140 or your diastolic blood pressure is over 90.  You have kidney disease and  your blood pressure is above 140/90.  You have heart disease and your blood pressure is above 140/90. Your personal target blood pressure may vary depending on your medical conditions, your age, and other factors. HOME CARE INSTRUCTIONS  Have your blood pressure rechecked as directed by your health care provider.   Take medicines only as directed by your health care provider. Follow the directions carefully. Blood pressure medicines must be taken as prescribed. The medicine does not work as well when you skip doses. Skipping doses also puts you at risk for problems.  Do not smoke.   Monitor your blood pressure at home as directed by your health care provider. SEEK MEDICAL CARE IF:   You think you are having a reaction to medicines taken.  You have recurrent headaches or feel dizzy.  You have swelling in your ankles.  You have trouble with your vision. SEEK IMMEDIATE MEDICAL CARE IF:  You develop a severe headache or confusion.  You have unusual weakness, numbness, or feel faint.  You have severe chest or abdominal pain.  You vomit repeatedly.  You have trouble breathing. MAKE SURE YOU:   Understand these instructions.  Will watch your condition.  Will get help right away if you are not doing well or get worse.   This information is not intended to replace advice given to you by your health care provider. Make sure you discuss any questions you have with your health care provider.   Document Released: 11/27/2005 Document Revised: 04/13/2015 Document Reviewed: 09/19/2013 Elsevier Interactive Patient Education [employer]
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
## 2016-03-21 ENCOUNTER — Ambulatory Visit: Payer: Self-pay | Admitting: Internal Medicine

## 2016-12-08 ENCOUNTER — Encounter (HOSPITAL_BASED_OUTPATIENT_CLINIC_OR_DEPARTMENT_OTHER): Payer: Self-pay | Admitting: *Deleted

## 2016-12-08 ENCOUNTER — Emergency Department (HOSPITAL_BASED_OUTPATIENT_CLINIC_OR_DEPARTMENT_OTHER)
Admission: EM | Admit: 2016-12-08 | Discharge: 2016-12-08 | Disposition: A | Discharge status: Home / Self Care | Payer: Self-pay | Attending: Emergency Medicine | Admitting: Emergency Medicine

## 2016-12-08 DIAGNOSIS — R319 Hematuria, unspecified: Secondary | ICD-10-CM

## 2016-12-08 DIAGNOSIS — N390 Urinary tract infection, site not specified: Secondary | ICD-10-CM | POA: Insufficient documentation

## 2016-12-08 DIAGNOSIS — Z79899 Other long term (current) drug therapy: Secondary | ICD-10-CM | POA: Insufficient documentation

## 2016-12-08 DIAGNOSIS — I10 Essential (primary) hypertension: Secondary | ICD-10-CM | POA: Insufficient documentation

## 2016-12-08 LAB — URINALYSIS, ROUTINE W REFLEX MICROSCOPIC
GLUCOSE, UA: NEGATIVE mg/dL
Ketones, ur: 15 mg/dL — AB
Nitrite: NEGATIVE
PH: 6 (ref 5.0–8.0)
Protein, ur: 100 mg/dL — AB
Specific Gravity, Urine: 1.029 (ref 1.005–1.030)

## 2016-12-08 LAB — URINALYSIS, MICROSCOPIC (REFLEX)

## 2016-12-08 MED ORDER — CIPROFLOXACIN HCL 500 MG PO TABS: 500.0000 mg | ORAL_TABLET | Freq: Two times a day (BID) | ORAL | 0 refills | Status: DC

## 2016-12-08 MED ORDER — CEFTRIAXONE SODIUM 250 MG IJ SOLR
250.0000 mg | Freq: Once | INTRAMUSCULAR | Status: AC
  Administered 2016-12-08: 250 mg via INTRAMUSCULAR
  Filled 2016-12-08: qty 250

## 2016-12-08 MED ORDER — AZITHROMYCIN 250 MG PO TABS
1000.0000 mg | ORAL_TABLET | Freq: Once | ORAL | Status: AC
  Administered 2016-12-08: 1000 mg via ORAL
  Filled 2016-12-08: qty 4

## 2016-12-08 NOTE — ED Provider Notes (Signed)
[facility] Note   CSN: [id number] Arrival date & time: 12/08/16  1747  By signing my name below, I, Clovis PuAvnee Patel, attest that this documentation has been prepared under the direction and in the presence of  RaytheonJosh Gamble Enderle PA-C. Electronically Signed: Clovis PuAvnee Patel, ED Scribe. 12/08/16. 7:48 PM.   History   Chief Complaint Chief Complaint  Patient presents with  . Hematuria   The history is provided by the patient. No language interpreter was used.   HPI Comments:  Ronald Charonhomas Philbrook is a 35 y.o. male, with a hx of HTN, who presents to the Emergency Department complaining of sudden onset, worsening hematuria x yesterday. Pt also reports dysuria, lower abdominal pain, chills and bilateral flank pain. He states he is sexually active. No alleviating factors noted. Pt denies pus-like discharge, fevers, pain during a bowel movement, any other associated symptoms and modifying factors at this time. No known drug allergies.    Past Medical History:  Diagnosis Date  . Hypertension     Patient Active Problem List   Diagnosis Date Noted  . Hypertension   . BRADYCARDIA 01/18/2009    Past Surgical History:  Procedure Laterality Date  . NO PAST SURGERIES      Home Medications    Prior to Admission medications   Medication Sig Start Date End Date Taking? Authorizing Provider  lisinopril (PRINIVIL,ZESTRIL) 10 MG tablet Take 1 tablet (10 mg total) by mouth daily. 02/23/16  Yes Newt LukesValerie A Leschber, MD  ciprofloxacin (CIPRO) 500 MG tablet Take 1 tablet (500 mg total) by mouth 2 (two) times daily. 12/08/16   Renne CriglerJoshua Kahley Leib, PA-C    Family History Family History  Problem Relation Age of Onset  . Adopted: Yes    Social History Social History  Substance Use Topics  . Smoking status: Never Smoker  . Smokeless tobacco: Never Used  . Alcohol use No     Allergies   Patient has no known allergies.   Review of Systems Review of Systems  Constitutional: Positive for  appetite change and chills. Negative for fever.  HENT: Negative for rhinorrhea and sore throat.   Eyes: Negative for redness.  Respiratory: Negative for cough.   Cardiovascular: Negative for chest pain.  Gastrointestinal: Positive for abdominal pain. Negative for diarrhea, nausea and vomiting.  Genitourinary: Positive for dysuria, flank pain and hematuria. Negative for decreased urine volume, discharge, penile pain, penile swelling, scrotal swelling and testicular pain.  Musculoskeletal: Negative for myalgias.  Skin: Negative for rash.  Neurological: Negative for headaches.   Physical Exam Updated Vital Signs BP 149/94   Pulse 78   Temp 98.5 F (36.9 C) (Oral)   Resp 18   Ht 6\' 3"  (1.905 m)   Wt 180 lb (81.6 kg)   SpO2 100%   BMI 22.50 kg/m   Physical Exam  Constitutional: He is oriented to person, place, and time. He appears well-developed and well-nourished. No distress.  HENT:  Head: Normocephalic and atraumatic.  Eyes: Conjunctivae are normal.  Neck: Normal range of motion. Neck supple.  Cardiovascular: Normal rate.   Pulmonary/Chest: Effort normal. No respiratory distress.  Abdominal: He exhibits no distension.  Genitourinary: Testes normal. Circumcised. Discharge (blood mixed with pus) found.  Lymphadenopathy: No inguinal adenopathy noted on the right or left side.  Neurological: He is alert and oriented to person, place, and time.  Skin: Skin is warm and dry.  Psychiatric: He has a normal mood and affect.  Nursing note and vitals reviewed.  ED Treatments /  Results  DIAGNOSTIC STUDIES:  Oxygen Saturation is 100% on RA, normal by my interpretation.    COORDINATION OF CARE:  7:47 PM Will prescribe antibiotics and do a urine culture. Will test and treat for GC/chlamydia. Will cover with cipro for cystitis/prostatitis. Discussed treatment plan with pt at bedside and pt agreed to plan.  Labs (all labs ordered are listed, but only abnormal results are  displayed) Labs Reviewed  URINALYSIS, ROUTINE W REFLEX MICROSCOPIC - Abnormal; Notable for the following:       Result Value   Color, Urine RED (*)    APPearance TURBID (*)    Hgb urine dipstick LARGE (*)    Bilirubin Urine SMALL (*)    Ketones, ur 15 (*)    Protein, ur 100 (*)    Leukocytes, UA LARGE (*)    All other components within normal limits  URINALYSIS, MICROSCOPIC (REFLEX) - Abnormal; Notable for the following:    Bacteria, UA MANY (*)    Squamous Epithelial / LPF 0-5 (*)    All other components within normal limits  URINE CULTURE  GC/CHLAMYDIA PROBE AMP ([place]) NOT AT [facility]   Procedures Procedures (including critical care time)  Medications Ordered in ED Medications  azithromycin (ZITHROMAX) tablet 1,000 mg (1,000 mg Oral Given 12/08/16 2026)  cefTRIAXone (ROCEPHIN) injection 250 mg (250 mg Intramuscular Given 12/08/16 2026)     Initial Impression / Assessment and Plan / ED Course  I have reviewed the triage vital signs and the nursing notes.  Pertinent labs & imaging results that were available during my care of the patient were reviewed by me and considered in my medical decision making (see chart for details).  Clinical Course    Patient seen and examined. Work-up initiated. Medications ordered.   Vital signs reviewed and are as follows: BP 149/94   Pulse 78   Temp 98.5 F (36.9 C) (Oral)   Resp 18   Ht 6\' 3"  (1.905 m)   Wt 81.6 kg   SpO2 100%   BMI 22.50 kg/m   Patient urged to return with worsening symptoms or other concerns. F/u with PCP if not improved in 1 week ([facility] Patient verbalized understanding and agrees with plan.    Final Clinical Impressions(s) / ED Diagnoses   Final diagnoses:  Urinary tract infection with hematuria, site unspecified   Patient with hematuria and pyuria. Most likely urethritis. Patient tested and treated for gonorrhea and chlamydia. Patient is sexually active with one partner.  Cannot entirely rule out cystitis/prostatitis, however patient appears well, nontoxic. No signs of pyelonephritis. Will cover with Cipro.    New Prescriptions New Prescriptions   CIPROFLOXACIN (CIPRO) 500 MG TABLET    Take 1 tablet (500 mg total) by mouth 2 (two) times daily.  I personally performed the services described in this documentation, which was scribed in my presence. The recorded information has been reviewed and is accurate.     Renne CriglerJoshua Keean Wilmeth, PA-C 12/08/16 2045    Laurence Spatesachel Morgan Little, MD 12/08/16 [phone]

## 2016-12-08 NOTE — ED Triage Notes (Signed)
Hematuria since yesterday 

## 2016-12-08 NOTE — Discharge Instructions (Signed)
Please read and follow all provided instructions.  Your diagnoses today include:  1. Urinary tract infection with hematuria, site unspecified     Tests performed today include:  Test for gonorrhea and chlamydia.   Urine test for bacterial infection  Vital signs. See below for your results today.   Medications:  You were treated with azithromycin and rocephin today.    Ciprofloxacin - antibiotic  You have been prescribed an antibiotic medicine: take the entire course of medicine even if you are feeling better. Stopping early can cause the antibiotic not to work.  Home care instructions:  Read educational materials contained in this packet and follow any instructions provided.   Return instructions:   Please return to the Emergency Department if you experience worsening symptoms.   Return with fever, vomiting, severe back pain.   Please return if you have any other emergent concerns.  Additional Information:  Your vital signs today were: BP 149/94    Pulse 78    Temp 98.5 F (36.9 C) (Oral)    Resp 18    Ht 6\' 3"  (1.905 m)    Wt 81.6 kg    SpO2 100%    BMI 22.50 kg/m  If your blood pressure (BP) was elevated above 135/85 this visit, please have this repeated by your doctor within one month. --------------

## 2016-12-10 LAB — URINE CULTURE

## 2016-12-12 LAB — GC/CHLAMYDIA PROBE AMP (~~LOC~~) NOT AT ARMC
Chlamydia: POSITIVE — AB
Neisseria Gonorrhea: POSITIVE — AB

## 2018-09-30 ENCOUNTER — Ambulatory Visit: Payer: Self-pay | Admitting: Family Medicine

## 2019-07-31 ENCOUNTER — Other Ambulatory Visit: Payer: Self-pay

## 2019-07-31 ENCOUNTER — Ambulatory Visit
Admission: EM | Admit: 2019-07-31 | Discharge: 2019-07-31 | Disposition: A | Discharge status: Home / Self Care | Payer: Self-pay

## 2019-07-31 ENCOUNTER — Encounter: Payer: Self-pay | Admitting: Emergency Medicine

## 2019-07-31 DIAGNOSIS — T1592XA Foreign body on external eye, part unspecified, left eye, initial encounter: Secondary | ICD-10-CM

## 2019-07-31 NOTE — ED Notes (Signed)
Patient able to ambulate independently  

## 2019-07-31 NOTE — Discharge Instructions (Signed)
You are given tetracaine drops in office today. Keep ophthalmology appointment for further evaluation/foreign body removal.

## 2019-07-31 NOTE — ED Triage Notes (Signed)
Pt presents to [facility] for assessment of 1 day of left eye irritation, tearing, and burning with eyes closed (difficulty sleeping last night).  Wind and sunlight bother it a lot as well.  Patient works in a warehouse, where the irritation originally began, but denies known foreign body.

## 2019-07-31 NOTE — ED Provider Notes (Signed)
[name]    CSN: [id number] Arrival date & time: 07/31/19  1249      History   Chief Complaint Chief Complaint  Patient presents with  . Foreign Body in [place] is a 38 y.o. male presenting for left eye irritation, tearing, burning, photophobia since yesterday.  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  Patient not routinely wearing eye protection.  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  Patient has tried home tap water irrigation numerous times without relief.   Past Medical History:  Diagnosis Date  . Hypertension     Patient Active Problem List   Diagnosis Date Noted  . Hypertension   . BRADYCARDIA 01/18/2009    Past Surgical History:  Procedure Laterality Date  . NO PAST SURGERIES         Home Medications    Prior to Admission medications   Medication Sig Start Date End Date Taking? Authorizing Provider  lisinopril (PRINIVIL,ZESTRIL) 10 MG tablet Take 1 tablet (10 mg total) by mouth daily. 02/23/16 07/31/19  Rowe Clack, MD    Family History Family History  Adopted: Yes    Social History Social History   Tobacco Use  . Smoking status: Never Smoker  . Smokeless tobacco: Never Used  Substance Use Topics  . Alcohol use: No  . Drug use: No     Allergies   Patient has no known allergies.   Review of Systems Review of Systems  Constitutional: Negative for fatigue and fever.  HENT: Negative for ear pain, hearing loss, sore throat, trouble swallowing and voice change.   Eyes: Positive for photophobia, pain, discharge, redness and visual disturbance. Negative for itching.  Respiratory: Negative for cough and shortness of breath.   Cardiovascular: Negative for chest pain and palpitations.  Gastrointestinal: Negative for abdominal pain, diarrhea and vomiting.  Musculoskeletal: Negative for arthralgias and myalgias.   Skin: Negative for rash and wound.  Neurological: Negative for speech difficulty and headaches.  All other systems reviewed and are negative.    Physical Exam Triage Vital Signs ED Triage Vitals  Enc Vitals Group     BP 07/31/19 1304 (!) 151/94     Pulse Rate 07/31/19 1304 (!) 45     Resp 07/31/19 1304 18     Temp 07/31/19 1304 98.1 F (36.7 C)     Temp Source 07/31/19 1304 Oral     SpO2 07/31/19 1304 97 %     Weight --      Height --      Head Circumference --      Peak Flow --      Pain Score 07/31/19 1305 7     Pain Loc --      Pain Edu? --      Excl. in [place]? --    No data found.  Updated Vital Signs BP (!) 151/94 (BP Location: Left Arm)   Pulse (!) 45   Temp 98.1 F (36.7 C) (Oral)   Resp 18   SpO2 97%   Visual Acuity Right Eye Distance: 20/30 Left Eye Distance: 20/70 Bilateral Distance: 20/30  Right Eye Near:   Left Eye Near:    Bilateral Near:     Physical Exam Constitutional:      General: He is not in acute distress. HENT:     Head: Normocephalic and atraumatic.  Eyes:  General: No scleral icterus.    Extraocular Movements: Extraocular movements intact.     Pupils: Pupils are equal, round, and reactive to light.     Comments: Mild conjunctival edema with moderate injection of left eye.  Small (<1 mm) foreign body identified inferolateral to pupil.  No foreign body identified with lid eversion.  Pain alleviated by tetracaine drops.  Cardiovascular:     Rate and Rhythm: Normal rate.  Pulmonary:     Effort: Pulmonary effort is normal.  Skin:    Coloration: Skin is not jaundiced or pale.  Neurological:     Mental Status: He is alert and oriented to person, place, and time.      UC Treatments / Results  Labs (all labs ordered are listed, but only abnormal results are displayed) Labs Reviewed - No data to display  EKG   Radiology No results found.  Procedures Procedures (including critical care time)  Medications Ordered in UC  Medications - No data to display  Initial Impression / Assessment and Plan / UC Course  I have reviewed the triage vital signs and the nursing notes.  Pertinent labs & imaging results that were available during my care of the patient were reviewed by me and considered in my medical decision making (see chart for details).     1.  Warm body of left eye Foreign body evident on exam.  Pain improved with tetracaine drops.  Fluorescein dye deferred in lieu of of ophthalmology appointment that was scheduled while patient was in office for 3 PM today.  Verbalized understanding of appointment, will go straight from here to the [facility] office on [address] in [place].  Return precautions discussed, patient verbalized understanding and is agreeable to plan. Final Clinical Impressions(s) / UC Diagnoses   Final diagnoses:  Foreign body, eye, left, initial encounter     Discharge Instructions     You are given tetracaine drops in office today. Keep ophthalmology appointment for further evaluation/foreign body removal.    ED Prescriptions    None     Controlled Substance Prescriptions [place] Controlled Substance Registry consulted? Not Applicable   Shea EvansHall-Potvin, , [place] JerseyPA-C 07/31/19 1348

## 2019-10-20 ENCOUNTER — Telehealth: Payer: Self-pay

## 2019-10-20 ENCOUNTER — Other Ambulatory Visit: Payer: Self-pay

## 2019-10-20 ENCOUNTER — Ambulatory Visit
Admission: EM | Admit: 2019-10-20 | Discharge: 2019-10-20 | Disposition: A | Discharge status: Home / Self Care | Payer: PRIVATE HEALTH INSURANCE | Attending: Physician Assistant | Admitting: Physician Assistant

## 2019-10-20 DIAGNOSIS — J020 Streptococcal pharyngitis: Secondary | ICD-10-CM

## 2019-10-20 DIAGNOSIS — I10 Essential (primary) hypertension: Secondary | ICD-10-CM

## 2019-10-20 DIAGNOSIS — Z20828 Contact with and (suspected) exposure to other viral communicable diseases: Secondary | ICD-10-CM

## 2019-10-20 DIAGNOSIS — J029 Acute pharyngitis, unspecified: Secondary | ICD-10-CM

## 2019-10-20 DIAGNOSIS — R059 Cough, unspecified: Secondary | ICD-10-CM

## 2019-10-20 DIAGNOSIS — R05 Cough: Secondary | ICD-10-CM

## 2019-10-20 LAB — POCT RAPID STREP A (OFFICE): Rapid Strep A Screen: POSITIVE — AB

## 2019-10-20 MED ORDER — AMOXICILLIN 500 MG PO CAPS: 500.0000 mg | ORAL_CAPSULE | Freq: Two times a day (BID) | ORAL | 0 refills | Status: DC

## 2019-10-20 MED ORDER — DEXAMETHASONE SODIUM PHOSPHATE 10 MG/ML IJ SOLN
10.0000 mg | Freq: Once | INTRAMUSCULAR | Status: AC
  Administered 2019-10-20: 10 mg via INTRAMUSCULAR

## 2019-10-20 NOTE — ED Provider Notes (Signed)
[name]    CSN: [id number] Arrival date & time: 10/20/19  0947      History   Chief Complaint Chief Complaint  Patient presents with  . Headache    HPI [name] is a 38 y.o. male.   38 year old male comes in for 2-day history of URI symptoms.  Has had sinus pressure, cough, sore throat, fever, body aches, fatigue.  Denies rhinorrhea, nasal congestion.  Denies abdominal pain, nausea, vomiting, diarrhea.  Denies shortness of breath, loss of taste or smell.  Never smoker.  No flu shot this year.  No sick/Covid contact.     Past Medical History:  Diagnosis Date  . Hypertension     Patient Active Problem List   Diagnosis Date Noted  . Hypertension   . BRADYCARDIA 01/18/2009    Past Surgical History:  Procedure Laterality Date  . NO PAST SURGERIES         Home Medications    Prior to Admission medications   Medication Sig Start Date End Date Taking? Authorizing Provider  amoxicillin (AMOXIL) 500 MG capsule Take 1 capsule (500 mg total) by mouth 2 (two) times daily. 10/20/19   Tasia Catchings, Carnelius Hammitt V, PA-C  lisinopril (PRINIVIL,ZESTRIL) 10 MG tablet Take 1 tablet (10 mg total) by mouth daily. 02/23/16 07/31/19  Rowe Clack, MD    Family History Family History  Adopted: Yes    Social History Social History   Tobacco Use  . Smoking status: Never Smoker  . Smokeless tobacco: Never Used  Substance Use Topics  . Alcohol use: No  . Drug use: No     Allergies   Patient has no known allergies.   Review of Systems Review of Systems  Reason unable to perform ROS: See HPI as above.     Physical Exam Triage Vital Signs ED Triage Vitals [10/20/19 1009]  Enc Vitals Group     BP (!) 148/101     Pulse Rate (!) 102     Resp 18     Temp 99.2 F (37.3 C)     Temp Source Oral     SpO2 97 %     Weight      Height      Head Circumference      Peak Flow      Pain Score 9     Pain Loc      Pain Edu?      Excl. in [place]?    No data found.   Updated Vital Signs BP (!) 148/101 (BP Location: Left Arm)   Pulse (!) 102   Temp 99.2 F (37.3 C) (Oral)   Resp 18   SpO2 97%   Physical Exam Constitutional:      General: He is not in acute distress.    Appearance: Normal appearance. He is not ill-appearing, toxic-appearing or diaphoretic.  HENT:     Head: Normocephalic and atraumatic.     Nose:     Right Sinus: Frontal sinus tenderness present. No maxillary sinus tenderness.     Left Sinus: Frontal sinus tenderness present. No maxillary sinus tenderness.     Mouth/Throat:     Mouth: Mucous membranes are moist.     Pharynx: Oropharynx is clear. Uvula midline. Posterior oropharyngeal erythema present.     Tonsils: No tonsillar exudate. 2+ on the right. 2+ on the left.  Neck:     Musculoskeletal: Normal range of motion and neck supple.  Cardiovascular:     Rate and Rhythm:  Normal rate and regular rhythm.     Heart sounds: Normal heart sounds. No murmur. No friction rub. No gallop.   Pulmonary:     Effort: Pulmonary effort is normal. No accessory muscle usage, prolonged expiration, respiratory distress or retractions.     Comments: Lungs clear to auscultation without adventitious lung sounds. Neurological:     General: No focal deficit present.     Mental Status: He is alert and oriented to person, place, and time.      UC Treatments / Results  Labs (all labs ordered are listed, but only abnormal results are displayed) Labs Reviewed  POCT RAPID STREP A (OFFICE) - Abnormal; Notable for the following components:      Result Value   Rapid Strep A Screen Positive (*)    All other components within normal limits  NOVEL CORONAVIRUS, NAA    EKG   Radiology No results found.  Procedures Procedures (including critical care time)  Medications Ordered in UC Medications  dexamethasone (DECADRON) injection 10 mg (10 mg Intramuscular Given 10/20/19 1109)    Initial Impression / Assessment and Plan / UC Course  I have  reviewed the triage vital signs and the nursing notes.  Pertinent labs & imaging results that were available during my care of the patient were reviewed by me and considered in my medical decision making (see chart for details).    Rapid strep positive.  Will start amoxicillin as directed.  However, given also was cough, sinus pressure, cannot rule out secondary infection of Covid.  Will send for Covid testing, patient to quarantine until testing results return.  Return precautions given.  Patient expresses understanding and agrees to plan.  Final Clinical Impressions(s) / UC Diagnoses   Final diagnoses:  Sore throat  Cough  Streptococcal sore throat    ED Prescriptions    Medication Sig Dispense Auth. Provider   amoxicillin (AMOXIL) 500 MG capsule Take 1 capsule (500 mg total) by mouth 2 (two) times daily. 20 capsule Belinda Fisher, PA-C     PDMP not reviewed this encounter.   Belinda Fisher, PA-C 10/20/19 1156

## 2019-10-20 NOTE — Discharge Instructions (Signed)
Rapid strep positive. As discussed, cannot rule out COVID. Currently, no alarming signs. Testing ordered. I would like you to quarantine until testing results.  Start amoxicillin as directed.  If experiencing shortness of breath, trouble breathing, go to the emergency department for further evaluation needed.

## 2019-10-20 NOTE — ED Triage Notes (Signed)
Pt c/o headache, sore throat, cough, fever, body aches, and fatigue since yesterday

## 2019-10-21 LAB — NOVEL CORONAVIRUS, NAA: SARS-CoV-2, NAA: NOT DETECTED

## 2019-11-12 ENCOUNTER — Ambulatory Visit
Admission: EM | Admit: 2019-11-12 | Discharge: 2019-11-12 | Disposition: A | Discharge status: Home / Self Care | Payer: Self-pay | Attending: Physician Assistant | Admitting: Physician Assistant

## 2019-11-12 DIAGNOSIS — T1592XA Foreign body on external eye, part unspecified, left eye, initial encounter: Secondary | ICD-10-CM

## 2019-11-12 MED ORDER — OFLOXACIN 0.3 % OP SOLN: 1.0000 [drp] | Freq: Four times a day (QID) | OPHTHALMIC | 0 refills | Status: AC

## 2019-11-12 NOTE — ED Provider Notes (Signed)
[name]    CSN: [id number] Arrival date & time: 11/12/19  1028      History   Chief Complaint Chief Complaint  Patient presents with  . Eye Pain    HPI [name] is a 38 y.o. male.   38 year old male comes in for 2 day history of left eye pain and watering. 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 States symptoms started at night, with watering of the eyes. Photophobia, blurry vision. Denies purulent discharge, eye crusting. Need glasses, but does not have it. Did wear protective glasses during work. Has not tried anything.      Past Medical History:  Diagnosis Date  . Hypertension     Patient Active Problem List   Diagnosis Date Noted  . Hypertension   . BRADYCARDIA 01/18/2009    Past Surgical History:  Procedure Laterality Date  . NO PAST SURGERIES         Home Medications    Prior to Admission medications   Medication Sig Start Date End Date Taking? Authorizing Provider  ofloxacin (OCUFLOX) 0.3 % ophthalmic solution Place 1 drop into the left eye 4 (four) times daily for 7 days. 11/12/19 11/19/19  Cathie Hoops, Amy V, PA-C  lisinopril (PRINIVIL,ZESTRIL) 10 MG tablet Take 1 tablet (10 mg total) by mouth daily. 02/23/16 07/31/19  Newt Lukes, MD    Family History Family History  Adopted: Yes    Social History Social History   Tobacco Use  . Smoking status: Never Smoker  . Smokeless tobacco: Never Used  Substance Use Topics  . Alcohol use: No  . Drug use: No     Allergies   Patient has no known allergies.   Review of Systems Review of Systems  Reason unable to perform ROS: See HPI as above.     Physical Exam Triage Vital Signs ED Triage Vitals [11/12/19 1050]  Enc Vitals Group     BP (!) 142/90     Pulse Rate (!) 56     Resp 16     Temp 98.1 F (36.7 C)     Temp src      SpO2 98 %     Weight      Height      Head Circumference      Peak Flow      Pain Score 8     Pain Loc      Pain Edu?      Excl.  in [place]?    No data found.  Updated Vital Signs BP (!) 142/90 (BP Location: Left Arm)   Pulse (!) 56   Temp 98.1 F (36.7 C)   Resp 16   SpO2 98%   Visual Acuity Right Eye Distance:   20/50 ([place]) Left Eye Distance:   20/50 ([place]) Bilateral Distance:   20/50 ([place])  Right Eye Near:   Left Eye Near:    Bilateral Near:     Physical Exam Constitutional:      General: He is not in acute distress.    Appearance: He is well-developed. He is not diaphoretic.  HENT:     Head: Normocephalic and atraumatic.  Eyes:     General: Lids are normal. Lids are everted, no foreign bodies appreciated.     Extraocular Movements: Extraocular movements intact.     Conjunctiva/sclera:     Right eye: Right conjunctiva is not injected.     Left eye: Left conjunctiva is injected.  Pupils: Pupils are equal, round, and reactive to light.      Comments: Fluorescein stain without uptake.  Negative Seidel sign.  Foreign body removed with cotton swab.  No further foreign body seen.  Recheck with negative Seidel sign.  Neck:     Musculoskeletal: Normal range of motion and neck supple.  Pulmonary:     Effort: Pulmonary effort is normal. No respiratory distress.  Skin:    General: Skin is warm and dry.  Neurological:     Mental Status: He is alert and oriented to person, place, and time.      UC Treatments / Results  Labs (all labs ordered are listed, but only abnormal results are displayed) Labs Reviewed - No data to display  EKG   Radiology No results found.  Procedures Procedures (including critical care time)  Medications Ordered in UC Medications - No data to display  Initial Impression / Assessment and Plan / UC Course  I have reviewed the triage vital signs and the nursing notes.  Pertinent labs & imaging results that were available during my care of the patient were reviewed by me and considered in my medical decision making (see chart for details).    Left eye foreign body  removed. No further foreign body seen. ?corneal ulcer due to Reddy defect seen. Will start ofloxacin and artificial tear gel as directed. Patient to follow up with ophthalmology for recheck in 1-2 days. Return precautions given. Patient expresses understanding and agrees to plan.  Final Clinical Impressions(s) / UC Diagnoses   Final diagnoses:  Foreign body of left eye, initial encounter   ED Prescriptions    Medication Sig Dispense Auth. Provider   ofloxacin (OCUFLOX) 0.3 % ophthalmic solution Place 1 drop into the left eye 4 (four) times daily for 7 days. 1.4 mL Ok Edwards, PA-C     PDMP not reviewed this encounter.   Ok Edwards, PA-C 11/12/19 1202

## 2019-11-12 NOTE — Discharge Instructions (Addendum)
Left eye foreign body removed. Start ofloxacin as directed. Artificial tear gel (systane/genteal) at night. Wait 10-15 minutes between drops, always use artificial tear gel last, as it prevents drops from penetrating through. Monitor for any worsening of symptoms, changes in vision, sensitivity to light, eye swelling, painful eye movement, follow up with ophthalmology for further evaluation. Otherwise, follow up with ophthalmologist 1-2 days for further evaluation needed.

## 2019-11-12 NOTE — ED Triage Notes (Signed)
Pt c/o lt eye pain and watery since yesterday. States has had metal in his eye before and feels the same.

## 2020-05-18 ENCOUNTER — Other Ambulatory Visit: Payer: Self-pay

## 2020-05-18 ENCOUNTER — Ambulatory Visit
Admission: EM | Admit: 2020-05-18 | Discharge: 2020-05-18 | Disposition: A | Discharge status: Home / Self Care | Payer: PRIVATE HEALTH INSURANCE | Attending: Physician Assistant | Admitting: Physician Assistant

## 2020-05-18 DIAGNOSIS — K644 Residual hemorrhoidal skin tags: Secondary | ICD-10-CM | POA: Diagnosis not present

## 2020-05-18 DIAGNOSIS — S91309A Unspecified open wound, unspecified foot, initial encounter: Secondary | ICD-10-CM

## 2020-05-18 LAB — POCT FASTING CBG KUC MANUAL ENTRY: POCT Glucose (KUC): 100 mg/dL — AB (ref 70–99)

## 2020-05-18 MED ORDER — MUPIROCIN 2 % EX OINT: 1.0000 "application " | TOPICAL_OINTMENT | Freq: Two times a day (BID) | CUTANEOUS | 0 refills | Status: DC

## 2020-05-18 MED ORDER — HYDROCORTISONE ACETATE 25 MG RE SUPP: 25.0000 mg | Freq: Two times a day (BID) | RECTAL | 0 refills | Status: DC

## 2020-05-18 MED ORDER — LIDOCAINE VISCOUS HCL 2 % MT SOLN: OROMUCOSAL | 0 refills | Status: DC

## 2020-05-18 MED ORDER — POLYETHYLENE GLYCOL 3350 17 G PO PACK: 17.0000 g | PACK | Freq: Every day | ORAL | 0 refills | Status: DC

## 2020-05-18 NOTE — ED Provider Notes (Signed)
[name]    CSN: [id number] Arrival date & time: 05/18/20  1025      History   Chief Complaint Chief Complaint  Patient presents with  . Rectal Bleeding    HPI [name] is a 39 y.o. male.   39 year old male comes in for multiple complaints.  1. Few day history of rectal pain that is present constantly, worse with BM. Currently with constipation. One streak of dark red blood with hard stools yesterday. Denies melena. Suprapubic pain this morning, cramping, no obvious aggravating or alleviating factors. No fevers. Warmth. No nausea/vomiting. Has had history of constipation.  2. Few week history of burning sensation to bilateral plantar feet, mostly to the arch when ROM or with weight bearing. No numbness/tingling. No swelling. No history of DM. History of pre-DM, no current PCP. Has had some polydipsia, polyuria.      History reviewed. No pertinent past medical history.  Patient Active Problem List   Diagnosis Date Noted  . Hypertension   . BRADYCARDIA 01/18/2009    Past Surgical History:  Procedure Laterality Date  . NO PAST SURGERIES         Home Medications    Prior to Admission medications   Medication Sig Start Date End Date Taking? Authorizing Provider  hydrocortisone (ANUSOL-HC) 25 MG suppository Place 1 suppository (25 mg total) rectally 2 (two) times daily. 05/18/20   Tasia Catchings, Yanis Juma V, PA-C  lidocaine (XYLOCAINE) 2 % solution As needed to affected area 05/18/20   Tasia Catchings, Davine Sweney V, PA-C  mupirocin ointment (BACTROBAN) 2 % Apply 1 application topically 2 (two) times daily. 05/18/20   Tasia Catchings, Archibald Marchetta V, PA-C  polyethylene glycol (MIRALAX) 17 g packet Take 17 g by mouth daily. 05/18/20   Tasia Catchings, Dayanara Sherrill V, PA-C  lisinopril (PRINIVIL,ZESTRIL) 10 MG tablet Take 1 tablet (10 mg total) by mouth daily. 02/23/16 07/31/19  Rowe Clack, MD    Family History Family History  Adopted: Yes    Social History Social History   Tobacco Use  . Smoking status: Never Smoker  .  Smokeless tobacco: Never Used  Substance Use Topics  . Alcohol use: No  . Drug use: No     Allergies   Patient has no known allergies.   Review of Systems Review of Systems  Reason unable to perform ROS: See HPI as above.     Physical Exam Triage Vital Signs ED Triage Vitals [05/18/20 1040]  Enc Vitals Group     BP (!) 147/88     Pulse Rate (!) 55     Resp 18     Temp 98 F (36.7 C)     Temp Source Oral     SpO2 98 %     Weight      Height      Head Circumference      Peak Flow      Pain Score 8     Pain Loc      Pain Edu?      Excl. in [place]?    No data found.  Updated Vital Signs BP (!) 147/88 (BP Location: Left Arm)   Pulse (!) 55   Temp 98 F (36.7 C) (Oral)   Resp 18   SpO2 98%   Physical Exam Exam conducted with a chaperone present.  Constitutional:      General: He is not in acute distress.    Appearance: Normal appearance. He is well-developed. He is not toxic-appearing or diaphoretic.  HENT:  Head: Normocephalic and atraumatic.  Eyes:     Conjunctiva/sclera: Conjunctivae normal.     Pupils: Pupils are equal, round, and reactive to light.  Pulmonary:     Effort: Pulmonary effort is normal. No respiratory distress.     Comments: Speaking in full sentences without difficulty Genitourinary:    Comments: nonthrombosed external hemorrhoid with small abrasion, no erythema, warmth. No tenderness to the rectum.  Musculoskeletal:     Cervical back: Normal range of motion and neck supple.     Comments: Dark colored material to the feet. After cleaning with EtOH, showed few blisters with opening to bilateral medial plantar surface. No surrounding erythema, warmth. Mild tenderness to palpation. Full ROM of ankle, toes, pedal pulse 2+  Skin:    General: Skin is warm and dry.  Neurological:     Mental Status: He is alert and oriented to person, place, and time.      UC Treatments / Results  Labs (all labs ordered are listed, but only abnormal  results are displayed) Labs Reviewed  POCT FASTING CBG [facility] MANUAL ENTRY - Abnormal; Notable for the following components:      Result Value   POCT Glucose ([facility]) 100 (*)    All other components within normal limits    EKG   Radiology No results found.  Procedures Procedures (including critical care time)  Medications Ordered in UC Medications - No data to display  Initial Impression / Assessment and Plan / UC Course  I have reviewed the triage vital signs and the nursing notes.  Pertinent labs & imaging results that were available during my care of the patient were reviewed by me and considered in my medical decision making (see chart for details).    1. External hemorrhoids Nonthrombosed. Symptomatic treatment provided. Discussed to start miralax for constipation. Return precautions given.  2. Blisters/wound to plantar feet [place] symptom onset, possible irritation from this. Wound care instructions given. Return precautions given. Patient expresses understanding and agrees to plan.  Final Clinical Impressions(s) / UC Diagnoses   Final diagnoses:  External hemorrhoid  Wound of foot    ED Prescriptions    Medication Sig Dispense Auth. Provider   hydrocortisone (ANUSOL-HC) 25 MG suppository Place 1 suppository (25 mg total) rectally 2 (two) times daily. 12 suppository Qamar Rosman V, PA-C   lidocaine (XYLOCAINE) 2 % solution As needed to affected area 50 mL Loreen Bankson V, PA-C   polyethylene glycol (MIRALAX) 17 g packet Take 17 g by mouth daily. 14 each Cathie Hoops, Ailyne Pawley V, PA-C   mupirocin ointment (BACTROBAN) 2 % Apply 1 application topically 2 (two) times daily. 22 g Belinda Fisher, PA-C     PDMP not reviewed this encounter.   Belinda Fisher, PA-C 05/18/20 1159

## 2020-05-18 NOTE — ED Triage Notes (Signed)
Pt c/o rectal pain for the past few days. States saw DRB in stool yesterday. Pt c/o burning to the bottom of both feet off and on for the past few weeks. States now has a dark spot to bottom of both feet.

## 2020-05-18 NOTE — Discharge Instructions (Signed)
External hemorrhoids Hydrocortisone suppository as needed.  You can use lidocaine to affected area to help with pain.  MiraLAX as directed for constipation. Keep hydrated, urine should be clear to pale yellow in color.  If having worsening pain, swelling, redness, warmth, fever, go to the emergency department for further evaluation.  Wound to feet If blisters to the sole of your foot, likely due to friction/irritation.  Keep area clean and dry.  You can dress with Bactroban ointment, and gauze to prevent friction.  Monitor for spreading redness, increased warmth, swelling, fever, follow-up for reevaluation.

## 2020-11-15 ENCOUNTER — Encounter: Payer: Self-pay | Admitting: Emergency Medicine

## 2020-11-15 ENCOUNTER — Ambulatory Visit
Admission: EM | Admit: 2020-11-15 | Discharge: 2020-11-15 | Disposition: A | Discharge status: Home / Self Care | Payer: PRIVATE HEALTH INSURANCE | Attending: Emergency Medicine | Admitting: Emergency Medicine

## 2020-11-15 DIAGNOSIS — K0889 Other specified disorders of teeth and supporting structures: Secondary | ICD-10-CM

## 2020-11-15 MED ORDER — IBUPROFEN 800 MG PO TABS: 800.0000 mg | ORAL_TABLET | Freq: Three times a day (TID) | ORAL | 0 refills | Status: DC

## 2020-11-15 MED ORDER — AMOXICILLIN 500 MG PO CAPS: 500.0000 mg | ORAL_CAPSULE | Freq: Three times a day (TID) | ORAL | 0 refills | Status: AC

## 2020-11-15 NOTE — ED Provider Notes (Signed)
[name]    CSN: [id number] Arrival date & time: 11/15/20  1135      History   Chief Complaint Chief Complaint  Patient presents with  . Dental Pain    HPI [name] is a 39 y.o. male presenting today for evaluation of dental pain.  Patient reports that he has had a broken tooth to his left lower jaw for a while which has had some intermittent pain.  Over the past few days he has had increased pain and swelling around this area.  Pain is causing headaches and to radiate into left temple area.  Denies any fevers.  Denies neck stiffness.  Denies difficulty swallowing.  Using Tylenol without relief.  HPI  History reviewed. No pertinent past medical history.  Patient Active Problem List   Diagnosis Date Noted  . Hypertension   . BRADYCARDIA 01/18/2009    Past Surgical History:  Procedure Laterality Date  . NO PAST SURGERIES         Home Medications    Prior to Admission medications   Medication Sig Start Date End Date Taking? Authorizing Provider  amoxicillin (AMOXIL) 500 MG capsule Take 1 capsule (500 mg total) by mouth 3 (three) times daily for 7 days. 11/15/20 11/22/20  Madhavi Hamblen C, PA-C  hydrocortisone (ANUSOL-HC) 25 MG suppository Place 1 suppository (25 mg total) rectally 2 (two) times daily. 05/18/20   Cathie Hoops, Amy V, PA-C  ibuprofen (ADVIL) 800 MG tablet Take 1 tablet (800 mg total) by mouth 3 (three) times daily. 11/15/20   Rebekha Diveley C, PA-C  lidocaine (XYLOCAINE) 2 % solution As needed to affected area 05/18/20   Cathie Hoops, Amy V, PA-C  mupirocin ointment (BACTROBAN) 2 % Apply 1 application topically 2 (two) times daily. 05/18/20   Cathie Hoops, Amy V, PA-C  polyethylene glycol (MIRALAX) 17 g packet Take 17 g by mouth daily. 05/18/20   Cathie Hoops, Amy V, PA-C  lisinopril (PRINIVIL,ZESTRIL) 10 MG tablet Take 1 tablet (10 mg total) by mouth daily. 02/23/16 07/31/19  Newt Lukes, MD    Family History Family History  Adopted: Yes    Social History Social History    Tobacco Use  . Smoking status: Never Smoker  . Smokeless tobacco: Never Used  Substance Use Topics  . Alcohol use: No  . Drug use: No     Allergies   Patient has no known allergies.   Review of Systems Review of Systems  Constitutional: Negative for activity change, appetite change, chills, fatigue and fever.  HENT: Positive for dental problem. Negative for congestion, ear pain, rhinorrhea, sinus pressure, sore throat and trouble swallowing.   Eyes: Negative for discharge and redness.  Respiratory: Negative for cough, chest tightness and shortness of breath.   Cardiovascular: Negative for chest pain.  Gastrointestinal: Negative for abdominal pain, diarrhea, nausea and vomiting.  Musculoskeletal: Negative for myalgias.  Skin: Negative for rash.  Neurological: Positive for headaches. Negative for dizziness and light-headedness.     Physical Exam Triage Vital Signs ED Triage Vitals [11/15/20 1228]  Enc Vitals Group     BP 131/89     Pulse Rate 98     Resp 18     Temp 98.3 F (36.8 C)     Temp Source Oral     SpO2 96 %     Weight      Height      Head Circumference      Peak Flow      Pain Score  Pain Loc      Pain Edu?      Excl. in [place]?    No data found.  Updated Vital Signs BP 131/89 (BP Location: Right Arm)   Pulse 98   Temp 98.3 F (36.8 C) (Oral)   Resp 18   SpO2 96%   Visual Acuity Right Eye Distance:   Left Eye Distance:   Bilateral Distance:    Right Eye Near:   Left Eye Near:    Bilateral Near:     Physical Exam Vitals and nursing note reviewed.  Constitutional:      Appearance: He is well-developed.     Comments: No acute distress  HENT:     Head: Normocephalic and atraumatic.     Ears:     Comments: Bilateral ears without tenderness to palpation of external auricle, tragus and mastoid, EAC's without erythema or swelling, TM's with good bony landmarks and cone of light. Non erythematous.     Nose: Nose normal.      Mouth/Throat:     Comments: Second to last molar to left lower jaw with fracture, surrounding gingival swelling and erythema, wisdom tooth on the side also appears impacted.  No soft palate swelling Eyes:     Conjunctiva/sclera: Conjunctivae normal.  Neck:     Comments: Full active ROM, no neck swelling or erythema Cardiovascular:     Rate and Rhythm: Normal rate.  Pulmonary:     Effort: Pulmonary effort is normal. No respiratory distress.  Abdominal:     General: There is no distension.  Musculoskeletal:        General: Normal range of motion.     Cervical back: Neck supple.  Skin:    General: Skin is warm and dry.  Neurological:     Mental Status: He is alert and oriented to person, place, and time.      UC Treatments / Results  Labs (all labs ordered are listed, but only abnormal results are displayed) Labs Reviewed - No data to display  EKG   Radiology No results found.  Procedures Procedures (including critical care time)  Medications Ordered in UC Medications - No data to display  Initial Impression / Assessment and Plan / UC Course  I have reviewed the triage vital signs and the nursing notes.  Pertinent labs & imaging results that were available during my care of the patient were reviewed by me and considered in my medical decision making (see chart for details).     Dental pain-amoxicillin to cover for infection, Tylenol and ibuprofen for pain and swelling.  Dental resources provided to follow-up with dentistry for further evaluation and management of tooth.  Discussed strict return precautions. Patient verbalized understanding and is agreeable with plan.  Final Clinical Impressions(s) / UC Diagnoses   Final diagnoses:  Pain, dental     Discharge Instructions     Please use dental resource to contact offices to seek permenant treatment/relief.   Today we have given you an antibiotic. This should help with pain as any infection is cleared.   For  pain please take 600mg -800mg  of Ibuprofen every 8 hours, take with 1000 mg of Tylenol Extra strength every 8 hours. These are safe to take together. Please take with food.   Please return if you start to experience significant swelling of your face, experiencing fever.    ED Prescriptions    Medication Sig Dispense Auth. Provider   amoxicillin (AMOXIL) 500 MG capsule Take 1 capsule (500 mg total)  by mouth 3 (three) times daily for 7 days. 21 capsule Kennen Stammer C, PA-C   ibuprofen (ADVIL) 800 MG tablet Take 1 tablet (800 mg total) by mouth 3 (three) times daily. 21 tablet Shaunak Kreis, [place] C, PA-C     PDMP not reviewed this encounter.   Sharyon Cable [place] C, PA-C 11/16/20 0840

## 2020-11-15 NOTE — Discharge Instructions (Signed)
Please use dental resource to contact offices to seek permenant treatment/relief.  ° °Today we have given you an antibiotic. This should help with pain as any infection is cleared.  ° °For pain please take 600mg-800mg of Ibuprofen every 8 hours, take with 1000 mg of Tylenol Extra strength every 8 hours. These are safe to take together. Please take with food.  ° °Please return if you start to experience significant swelling of your face, experiencing fever. °

## 2020-11-15 NOTE — ED Triage Notes (Signed)
Pt said he has a bad tooth on the left bottom and yesterday it began to hurt really bad, hard to chew. Swollen around the gum tissue

## 2022-03-01 ENCOUNTER — Other Ambulatory Visit: Payer: Self-pay

## 2022-03-01 ENCOUNTER — Ambulatory Visit
Admission: EM | Admit: 2022-03-01 | Discharge: 2022-03-01 | Disposition: A | Discharge status: Home / Self Care | Payer: PRIVATE HEALTH INSURANCE | Attending: Physician Assistant | Admitting: Physician Assistant

## 2022-03-01 DIAGNOSIS — R109 Unspecified abdominal pain: Secondary | ICD-10-CM

## 2022-03-01 DIAGNOSIS — R110 Nausea: Secondary | ICD-10-CM | POA: Diagnosis not present

## 2022-03-01 DIAGNOSIS — R197 Diarrhea, unspecified: Secondary | ICD-10-CM

## 2022-03-01 MED ORDER — ONDANSETRON 4 MG PO TBDP: 4.0000 mg | ORAL_TABLET | Freq: Three times a day (TID) | ORAL | 0 refills | Status: DC | PRN

## 2022-03-01 NOTE — ED Triage Notes (Signed)
Pt c/o nausea and diarrhea, onset yesterday. Associated mild periumbilical abdominal pain.  ? ?Denies lightheadedness or dizzy.  ?

## 2022-03-01 NOTE — ED Provider Notes (Signed)
?[name] ? ? ? ?CSN: [id number] ?Arrival date & time: 03/01/22  1202 ? ? ?  ? ?History   ?Chief Complaint ?Chief Complaint  ?Patient presents with  ? Nausea  ? ? ?HPI ?[name] is a 41 y.o. male.  ? ?Patient here today for evaluation of abdominal pain, nausea and diarrhea that started yesterday. He has not had fever. He reports pain as periumbilical and/ or right sided. He denies vomiting. He has not had abdominal surgery in the past. He does not report treatment for symptoms.  ? ?The history is provided by the patient.  ? ?History reviewed. No pertinent past medical history. ? ?Patient Active Problem List  ? Diagnosis Date Noted  ? Hypertension   ? BRADYCARDIA 01/18/2009  ? ? ?Past Surgical History:  ?Procedure Laterality Date  ? NO PAST SURGERIES    ? ? ? ? ? ?Home Medications   ? ?Prior to Admission medications   ?Medication Sig Start Date End Date Taking? Authorizing Provider  ?ondansetron (ZOFRAN-ODT) 4 MG disintegrating tablet Take 1 tablet (4 mg total) by mouth every 8 (eight) hours as needed. 03/01/22  Yes Tomi Bamberger, PA-C  ?hydrocortisone (ANUSOL-HC) 25 MG suppository Place 1 suppository (25 mg total) rectally 2 (two) times daily. 05/18/20   Cathie Hoops, Amy V, PA-C  ?ibuprofen (ADVIL) 800 MG tablet Take 1 tablet (800 mg total) by mouth 3 (three) times daily. 11/15/20   Wieters, Hallie C, PA-C  ?lidocaine (XYLOCAINE) 2 % solution As needed to affected area 05/18/20   Cathie Hoops, Amy V, PA-C  ?mupirocin ointment (BACTROBAN) 2 % Apply 1 application topically 2 (two) times daily. 05/18/20   Cathie Hoops, Amy V, PA-C  ?polyethylene glycol (MIRALAX) 17 g packet Take 17 g by mouth daily. 05/18/20   Cathie Hoops, Amy V, PA-C  ?lisinopril (PRINIVIL,ZESTRIL) 10 MG tablet Take 1 tablet (10 mg total) by mouth daily. 02/23/16 07/31/19  Newt Lukes, MD  ? ? ?Family History ?Family History  ?Adopted: Yes  ? ? ?Social History ?Social History  ? ?Tobacco Use  ? Smoking status: Never  ? Smokeless tobacco: Never  ?Substance Use Topics  ?  Alcohol use: No  ? Drug use: No  ? ? ? ?Allergies   ?Patient has no known allergies. ? ? ?Review of Systems ?Review of Systems  ?Constitutional:  Negative for chills and fever.  ?Eyes:  Negative for discharge and redness.  ?Gastrointestinal:  Positive for abdominal pain, diarrhea and nausea. Negative for vomiting.  ?Neurological:  Negative for numbness.  ? ? ?Physical Exam ?Triage Vital Signs ?ED Triage Vitals  ?Enc Vitals Group  ?   BP   ?   Pulse   ?   Resp   ?   Temp   ?   Temp src   ?   SpO2   ?   Weight   ?   Height   ?   Head Circumference   ?   Peak Flow   ?   Pain Score   ?   Pain Loc   ?   Pain Edu?   ?   Excl. in GC?   ? ?No data found. ? ?Updated Vital Signs ?BP 140/85 (BP Location: Right Arm)   Pulse (!) 47   Temp 98 ?F (36.7 ?C) (Oral)   Resp 18   SpO2 98%  ?   ? ?Physical Exam ?Vitals and nursing note reviewed.  ?Constitutional:   ?   General: He is not in acute  distress. ?   Appearance: Normal appearance. He is not ill-appearing.  ?HENT:  ?   Head: Normocephalic and atraumatic.  ?Eyes:  ?   Conjunctiva/sclera: Conjunctivae normal.  ?Cardiovascular:  ?   Rate and Rhythm: Normal rate.  ?Pulmonary:  ?   Effort: Pulmonary effort is normal.  ?Abdominal:  ?   General: Abdomen is flat. There is no distension.  ?   Tenderness: There is abdominal tenderness (diffuse TTP to right abdomen, central abdomen). There is no guarding.  ?Neurological:  ?   Mental Status: He is alert.  ?Psychiatric:     ?   Mood and Affect: Mood normal.     ?   Behavior: Behavior normal.     ?   Thought Content: Thought content normal.  ? ? ? ?UC Treatments / Results  ?Labs ?(all labs ordered are listed, but only abnormal results are displayed) ?Labs Reviewed - No data to display ? ?EKG ? ? ?Radiology ?No results found. ? ?Procedures ?Procedures (including critical care time) ? ?Medications Ordered in UC ?Medications - No data to display ? ?Initial Impression / Assessment and Plan / UC Course  ?I have reviewed the triage vital signs  and the nursing notes. ? ?Pertinent labs & imaging results that were available during my care of the patient were reviewed by me and considered in my medical decision making (see chart for details). ? ?  ?Discussed that I suspect viral etiology of symptoms given current regional outbreak, but also that his abdominal pain is somewhat suspicious for appendicitis. I recommended further evaluation in the ED with any persistent abdominal pain or worsening symptoms. Patient expresses understanding. Will treat with zofran to help with nausea.  ? ?Final Clinical Impressions(s) / UC Diagnoses  ? ?Final diagnoses:  ?Nausea  ?Diarrhea, unspecified type  ?Right sided abdominal pain  ? ? ? ?Discharge Instructions   ? ?  ? ?Please report to ED if right sided abdominal pain persists or worsens.  ? ? ? ? ? ?ED Prescriptions   ? ? Medication Sig Dispense Auth. Provider  ? ondansetron (ZOFRAN-ODT) 4 MG disintegrating tablet Take 1 tablet (4 mg total) by mouth every 8 (eight) hours as needed. 20 tablet Tomi Bamberger, PA-C  ? ?  ? ?PDMP not reviewed this encounter. ?  ?Tomi Bamberger, PA-C ?03/01/22 1500 ? ?

## 2022-03-01 NOTE — Discharge Instructions (Signed)
?  Please report to ED if right sided abdominal pain persists or worsens.  ? ?

## 2022-07-26 ENCOUNTER — Encounter: Payer: Self-pay | Admitting: Emergency Medicine

## 2022-07-26 ENCOUNTER — Ambulatory Visit (INDEPENDENT_AMBULATORY_CARE_PROVIDER_SITE_OTHER): Payer: PRIVATE HEALTH INSURANCE

## 2022-07-26 ENCOUNTER — Ambulatory Visit
Admission: EM | Admit: 2022-07-26 | Discharge: 2022-07-26 | Disposition: A | Discharge status: Home / Self Care | Payer: Self-pay

## 2022-07-26 DIAGNOSIS — M25571 Pain in right ankle and joints of right foot: Secondary | ICD-10-CM

## 2022-07-26 DIAGNOSIS — M79671 Pain in right foot: Secondary | ICD-10-CM

## 2022-07-26 NOTE — Discharge Instructions (Signed)
Your x-rays were normal.  Recommend ice application, elevation of extremity, Ace wrap.  Do not sleep in Ace wrap.  You may take ibuprofen or Tylenol as needed for pain.  Follow-up with orthopedist if symptoms persist or worsen.

## 2022-07-26 NOTE — ED Triage Notes (Signed)
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 Pt has pain in lower ankle/upper foot area esp with weight bearing and pressing of gas pedal. Had to leave work today due to pain with extended standing.

## 2022-07-26 NOTE — ED Provider Notes (Signed)
[name]    CSN: [id number] Arrival date & time: 07/26/22  1120      History   Chief Complaint Chief Complaint  Patient presents with   Ankle Pain   Foot Pain    HPI Zev Blue is a 41 y.o. male.   Patient presents with right ankle and foot pain that occurred after an injury yesterday.  Patient reports that he was running the bases while playing baseball when he stepped wrong on the side of the base causing his ankle to twist over.  He is having pain in the dorsal surface of the foot as well as in the anterior ankle.  Has pain with bearing weight.  He has not taken any medications to help alleviate symptoms.  Denies any numbness or tingling.   Ankle Pain Foot Pain    Past Medical History:  Diagnosis Date   Hypertension     Patient Active Problem List   Diagnosis Date Noted   Hypertension    BRADYCARDIA 01/18/2009    Past Surgical History:  Procedure Laterality Date   NO PAST SURGERIES         Home Medications    Prior to Admission medications   Medication Sig Start Date End Date Taking? Authorizing Provider  lisinopril (ZESTRIL) 5 MG tablet Take 5 mg by mouth daily.   Yes [provider]  hydrocortisone (ANUSOL-HC) 25 MG suppository Place 1 suppository (25 mg total) rectally 2 (two) times daily. 05/18/20   Cathie Hoops, Amy V, PA-C  ibuprofen (ADVIL) 800 MG tablet Take 1 tablet (800 mg total) by mouth 3 (three) times daily. 11/15/20   Wieters, Hallie C, PA-C  lidocaine (XYLOCAINE) 2 % solution As needed to affected area 05/18/20   Cathie Hoops, Amy V, PA-C  mupirocin ointment (BACTROBAN) 2 % Apply 1 application topically 2 (two) times daily. 05/18/20   Cathie Hoops, Amy V, PA-C  ondansetron (ZOFRAN-ODT) 4 MG disintegrating tablet Take 1 tablet (4 mg total) by mouth every 8 (eight) hours as needed. 03/01/22   Tomi Bamberger, PA-C  polyethylene glycol (MIRALAX) 17 g packet Take 17 g by mouth daily. 05/18/20   Belinda Fisher, PA-C    Family History Family History  Adopted:  Yes    Social History Social History   Tobacco Use   Smoking status: Never   Smokeless tobacco: Never  Substance Use Topics   Alcohol use: No   Drug use: No     Allergies   Patient has no known allergies.   Review of Systems Review of Systems Per HPI  Physical Exam Triage Vital Signs ED Triage Vitals  Enc Vitals Group     BP 07/26/22 1347 (!) 151/93     Pulse Rate 07/26/22 1347 (!) 44     Resp 07/26/22 1347 15     Temp 07/26/22 1347 98.1 F (36.7 C)     Temp Source 07/26/22 1347 Oral     SpO2 07/26/22 1347 97 %     Weight --      Height --      Head Circumference --      Peak Flow --      Pain Score 07/26/22 1345 10     Pain Loc --      Pain Edu? --      Excl. in GC? --    No data found.  Updated Vital Signs BP (!) 151/93 (BP Location: Left Arm)   Pulse (!) 44   Temp 98.1 F (36.7  C) (Oral)   Resp 15   SpO2 97%   Visual Acuity Right Eye Distance:   Left Eye Distance:   Bilateral Distance:    Right Eye Near:   Left Eye Near:    Bilateral Near:     Physical Exam Constitutional:      General: He is not in acute distress.    Appearance: Normal appearance. He is not toxic-appearing or diaphoretic.  HENT:     Head: Normocephalic and atraumatic.  Eyes:     Extraocular Movements: Extraocular movements intact.     Conjunctiva/sclera: Conjunctivae normal.  Pulmonary:     Effort: Pulmonary effort is normal.  Musculoskeletal:     Comments: Tenderness to palpation with mild swelling located to anterior ankle.  No discoloration, warmth, lacerations, abrasions noted.  Tenderness to palpation with associated mild swelling located to dorsal surface of foot directly below ankle overlying the proximal fourth metatarsal.  No discoloration, warmth, lacerations, abrasions noted.  Capillary refill and pulses intact.  Normal Achilles.  Negative Thompson's test.  Neurological:     General: No focal deficit present.     Mental Status: He is alert and oriented to  person, place, and time. Mental status is at baseline.  Psychiatric:        Mood and Affect: Mood normal.        Behavior: Behavior normal.        Thought Content: Thought content normal.        Judgment: Judgment normal.      UC Treatments / Results  Labs (all labs ordered are listed, but only abnormal results are displayed) Labs Reviewed - No data to display  EKG   Radiology DG Ankle Complete Right  Result Date: 07/26/2022 CLINICAL DATA:  Right ankle pain after injury. EXAM: RIGHT ANKLE - COMPLETE 3+ VIEW COMPARISON:  None Available. FINDINGS: There is no evidence of fracture, dislocation, or joint effusion. There is no evidence of arthropathy or other focal bone abnormality. Soft tissues are unremarkable. IMPRESSION: Negative. Electronically Signed   By: Lupita Raider M.D.   On: 07/26/2022 14:39   DG Foot Complete Right  Result Date: 07/26/2022 CLINICAL DATA:  Right foot pain after injury. EXAM: RIGHT FOOT COMPLETE - 3+ VIEW COMPARISON:  None Available. FINDINGS: There is no evidence of fracture or dislocation. Mild degenerative changes seen involving the first metatarsophalangeal joint. Soft tissues are unremarkable. IMPRESSION: Mild osteoarthritis of the first metatarsophalangeal joint. No acute abnormality seen. Electronically Signed   By: Lupita Raider M.D.   On: 07/26/2022 14:37    Procedures Procedures (including critical care time)  Medications Ordered in UC Medications - No data to display  Initial Impression / Assessment and Plan / UC Course  I have reviewed the triage vital signs and the nursing notes.  Pertinent labs & imaging results that were available during my care of the patient were reviewed by me and considered in my medical decision making (see chart for details).     X-rays were negative for any acute bony abnormality.  Notified patient that he does have arthritis in part of foot.  Ace wrap applied in urgent care.  Advised patient to not to sleep in  this.  Discussed ice application, elevation of extremity, supportive care, NSAIDs for pain relief.  Patient to follow-up with provided contact information for orthopedist for further evaluation and management if pain persists or worsens.  Discussed return precautions.  Patient verbalized understanding and was agreeable with plan.  Patient  does have low heart rate but this appears to be baseline for patient. Final Clinical Impressions(s) / UC Diagnoses   Final diagnoses:  Right foot pain  Acute right ankle pain     Discharge Instructions      Your x-rays were normal.  Recommend ice application, elevation of extremity, Ace wrap.  Do not sleep in Ace wrap.  You may take ibuprofen or Tylenol as needed for pain.  Follow-up with orthopedist if symptoms persist or worsen.     ED Prescriptions   None    PDMP not reviewed this encounter.   Gustavus Bryant, [place] 07/26/22 [id number]

## 2022-10-19 ENCOUNTER — Ambulatory Visit: Payer: Self-pay | Admitting: Internal Medicine

## 2022-11-03 ENCOUNTER — Ambulatory Visit
Admission: EM | Admit: 2022-11-03 | Discharge: 2022-11-03 | Disposition: A | Discharge status: Home / Self Care | Payer: Self-pay | Attending: Physician Assistant | Admitting: Physician Assistant

## 2022-11-03 DIAGNOSIS — J069 Acute upper respiratory infection, unspecified: Secondary | ICD-10-CM

## 2022-11-03 DIAGNOSIS — J029 Acute pharyngitis, unspecified: Secondary | ICD-10-CM

## 2022-11-03 MED ORDER — PREDNISONE 20 MG PO TABS: 40.0000 mg | ORAL_TABLET | Freq: Every day | ORAL | 0 refills | Status: AC

## 2022-11-03 NOTE — ED Triage Notes (Signed)
Pt presents to uc with co of sore throat since last week, no otc medications

## 2022-11-04 ENCOUNTER — Encounter: Payer: Self-pay | Admitting: Physician Assistant

## 2022-11-04 NOTE — ED Provider Notes (Signed)
[name]    CSN: [id number] Arrival date & time: 11/03/22  1334      History   Chief Complaint Chief Complaint  Patient presents with   Sore Throat    HPI [name] is a 41 y.o. male.   Patient here today for evaluation of sore throat that he has had since last week.  He has not taken any medication for symptoms.  He has had some nasal congestion.  He has not had any fever.  He denies any vomiting or diarrhea.  He reports minimal cough.  The history is provided by the patient.  Sore Throat Pertinent negatives include no abdominal pain and no shortness of breath.    Past Medical History:  Diagnosis Date   Hypertension     Patient Active Problem List   Diagnosis Date Noted   Hypertension    BRADYCARDIA 01/18/2009    Past Surgical History:  Procedure Laterality Date   NO PAST SURGERIES         Home Medications    Prior to Admission medications   Medication Sig Start Date End Date Taking? Authorizing Provider  predniSONE (DELTASONE) 20 MG tablet Take 2 tablets (40 mg total) by mouth daily with breakfast for 5 days. 11/03/22 11/08/22 Yes Tomi Bamberger, PA-C  hydrocortisone (ANUSOL-HC) 25 MG suppository Place 1 suppository (25 mg total) rectally 2 (two) times daily. 05/18/20   Cathie Hoops, Amy V, PA-C  ibuprofen (ADVIL) 800 MG tablet Take 1 tablet (800 mg total) by mouth 3 (three) times daily. 11/15/20   Wieters, Hallie C, PA-C  lidocaine (XYLOCAINE) 2 % solution As needed to affected area 05/18/20   Cathie Hoops, Amy V, PA-C  lisinopril (ZESTRIL) 5 MG tablet Take 5 mg by mouth daily.    [provider]  mupirocin ointment (BACTROBAN) 2 % Apply 1 application topically 2 (two) times daily. 05/18/20   Cathie Hoops, Amy V, PA-C  ondansetron (ZOFRAN-ODT) 4 MG disintegrating tablet Take 1 tablet (4 mg total) by mouth every 8 (eight) hours as needed. 03/01/22   Tomi Bamberger, PA-C  polyethylene glycol (MIRALAX) 17 g packet Take 17 g by mouth daily. 05/18/20   Belinda Fisher, PA-C     Family History Family History  Adopted: Yes    Social History Social History   Tobacco Use   Smoking status: Never   Smokeless tobacco: Never  Substance Use Topics   Alcohol use: No   Drug use: No     Allergies   Patient has no known allergies.   Review of Systems Review of Systems  Constitutional:  Negative for chills and fever.  HENT:  Positive for congestion and sore throat. Negative for ear pain.   Eyes:  Negative for discharge and redness.  Respiratory:  Positive for cough. Negative for shortness of breath.   Gastrointestinal:  Negative for abdominal pain, diarrhea, nausea and vomiting.     Physical Exam Triage Vital Signs ED Triage Vitals [11/03/22 1455]  Enc Vitals Group     BP (!) 141/76     Pulse Rate (!) 54     Resp 19     Temp 97.9 F (36.6 C)     Temp src      SpO2 98 %     Weight      Height      Head Circumference      Peak Flow      Pain Score 5     Pain Loc  Pain Edu?      Excl. in GC?    No data found.  Updated Vital Signs BP (!) 141/76   Pulse (!) 54   Temp 97.9 F (36.6 C)   Resp 19   SpO2 98%       Physical Exam Vitals and nursing note reviewed.  Constitutional:      General: He is not in acute distress.    Appearance: Normal appearance. He is not ill-appearing.  HENT:     Head: Normocephalic and atraumatic.     Nose: Nose normal. No congestion.     Mouth/Throat:     Mouth: Mucous membranes are moist.     Pharynx: Posterior oropharyngeal erythema present. No oropharyngeal exudate.  Eyes:     Conjunctiva/sclera: Conjunctivae normal.  Cardiovascular:     Rate and Rhythm: Normal rate and regular rhythm.     Heart sounds: Normal heart sounds. No murmur heard. Pulmonary:     Effort: Pulmonary effort is normal. No respiratory distress.     Breath sounds: Normal breath sounds. No wheezing, rhonchi or rales.  Skin:    General: Skin is warm and dry.  Neurological:     Mental Status: He is alert.  Psychiatric:         Mood and Affect: Mood normal.        Thought Content: Thought content normal.      UC Treatments / Results  Labs (all labs ordered are listed, but only abnormal results are displayed) Labs Reviewed - No data to display  EKG   Radiology No results found.  Procedures Procedures (including critical Erickson time)  Medications Ordered in UC Medications - No data to display  Initial Impression / Assessment and Plan / UC Course  I have reviewed the triage vital signs and the nursing notes.  Pertinent labs & imaging results that were available during my Erickson of the patient were reviewed by me and considered in my medical decision making (see chart for details).    Steroid burst prescribed to hopefully help improve symptoms of pharyngitis.  Suspect likely viral etiology of symptoms.  Recommended further evaluation if no gradual improvement or with any further concerns.  Patient expresses understanding.  Final Clinical Impressions(s) / UC Diagnoses   Final diagnoses:  Acute pharyngitis, unspecified etiology  Acute upper respiratory infection   Discharge Instructions   None    ED Prescriptions     Medication Sig Dispense Auth. Provider   predniSONE (DELTASONE) 20 MG tablet Take 2 tablets (40 mg total) by mouth daily with breakfast for 5 days. 10 tablet Tomi Bamberger, PA-C      PDMP not reviewed this encounter.   Tomi Bamberger, PA-C 11/04/22 [phone]

## 2023-01-23 ENCOUNTER — Ambulatory Visit (INDEPENDENT_AMBULATORY_CARE_PROVIDER_SITE_OTHER): Payer: Self-pay

## 2023-01-23 ENCOUNTER — Ambulatory Visit
Admission: RE | Admit: 2023-01-23 | Discharge: 2023-01-23 | Disposition: A | Discharge status: Home / Self Care | Payer: Self-pay | Source: Ambulatory Visit | Attending: Family Medicine | Admitting: Family Medicine

## 2023-01-23 ENCOUNTER — Other Ambulatory Visit: Payer: Self-pay

## 2023-01-23 VITALS — BP 148/85 | HR 55 | Temp 98.4°F | Resp 18

## 2023-01-23 DIAGNOSIS — M79671 Pain in right foot: Secondary | ICD-10-CM

## 2023-01-23 MED ORDER — IBUPROFEN 800 MG PO TABS: 800.0000 mg | ORAL_TABLET | Freq: Three times a day (TID) | ORAL | 0 refills | Status: DC | PRN

## 2023-01-23 MED ORDER — KETOROLAC TROMETHAMINE 30 MG/ML IJ SOLN
30.0000 mg | Freq: Once | INTRAMUSCULAR | Status: AC
  Administered 2023-01-23: 30 mg via INTRAMUSCULAR

## 2023-01-23 NOTE — ED Triage Notes (Signed)
Pt here for right foot pain after twisting when stepping off curb 6 days ago

## 2023-01-23 NOTE — Discharge Instructions (Signed)
There were no broken bones on your x-ray  You have been given a shot of Toradol 30 mg today.  Take ibuprofen 800 mg--1 tab every 8 hours as needed for pain.

## 2023-01-23 NOTE — ED Provider Notes (Addendum)
[name]    CSN: [id number] Arrival date & time: 01/23/23  1449      History   Chief Complaint Chief Complaint  Patient presents with   Foot Injury    Entered by patient    HPI [name] is a 42 y.o. male.    Foot Injury  Here for right foot pain. He turned his right ankle when putting out trash cans on February he had he thought he felt and heard a pop, and it is continued to hurt in the right lateral midfoot.  Also was little swollen there.  Past Medical History:  Diagnosis Date   Hypertension     Patient Active Problem List   Diagnosis Date Noted   Hypertension    BRADYCARDIA 01/18/2009    Past Surgical History:  Procedure Laterality Date   NO PAST SURGERIES         Home Medications    Prior to Admission medications   Medication Sig Start Date End Date Taking? Authorizing Provider  ibuprofen (ADVIL) 800 MG tablet Take 1 tablet (800 mg total) by mouth every 8 (eight) hours as needed (pain). 01/23/23  Yes Annel Zunker, Gwenlyn Perking, MD  hydrocortisone (ANUSOL-HC) 25 MG suppository Place 1 suppository (25 mg total) rectally 2 (two) times daily. 05/18/20   Tasia Catchings, Amy V, PA-C  lidocaine (XYLOCAINE) 2 % solution As needed to affected area 05/18/20   Tasia Catchings, Amy V, PA-C  lisinopril (ZESTRIL) 5 MG tablet Take 5 mg by mouth daily.    [provider]  polyethylene glycol (MIRALAX) 17 g packet Take 17 g by mouth daily. 05/18/20   Ok Edwards, PA-C    Family History Family History  Adopted: Yes    Social History Social History   Tobacco Use   Smoking status: Never   Smokeless tobacco: Never  Substance Use Topics   Alcohol use: No   Drug use: No     Allergies   Patient has no known allergies.   Review of Systems Review of Systems   Physical Exam Triage Vital Signs ED Triage Vitals [01/23/23 1516]  Enc Vitals Group     BP (!) 148/85     Pulse Rate (!) 55     Resp 18     Temp 98.4 F (36.9 C)     Temp Source Oral     SpO2 98 %      Weight      Height      Head Circumference      Peak Flow      Pain Score 3     Pain Loc      Pain Edu?      Excl. in [place]?    No data found.  Updated Vital Signs BP (!) 148/85 (BP Location: Left Arm)   Pulse (!) 55   Temp 98.4 F (36.9 C) (Oral)   Resp 18   SpO2 98%   Visual Acuity Right Eye Distance:   Left Eye Distance:   Bilateral Distance:    Right Eye Near:   Left Eye Near:    Bilateral Near:     Physical Exam Vitals reviewed.  Constitutional:      General: He is not in acute distress.    Appearance: He is not ill-appearing, toxic-appearing or diaphoretic.  Cardiovascular:     Pulses: Normal pulses.  Musculoskeletal:     Comments: Is point tenderness and some swelling about 2 cm in diameter over the dorsum of the right  foot and the area anterior and inferior to the lateral malleolus.  No open wound  Skin:    Coloration: Skin is not pale.  Neurological:     General: No focal deficit present.     Mental Status: He is alert and oriented to person, place, and time.      UC Treatments / Results  Labs (all labs ordered are listed, but only abnormal results are displayed) Labs Reviewed - No data to display  EKG   Radiology DG Foot Complete Right  Result Date: 01/23/2023 CLINICAL DATA:  Right foot pain after injury several days ago. EXAM: RIGHT FOOT COMPLETE - 3+ VIEW COMPARISON:  None Available. FINDINGS: There is no evidence of fracture or dislocation. Moderate degenerative changes seen involving the first metatarsophalangeal joint. Soft tissues are unremarkable. 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 No acute abnormality is noted. Electronically Signed   By: Marijo Conception M.D.   On: 01/23/2023 15:37    Procedures Procedures (including critical care time)  Medications Ordered in UC Medications  ketorolac (TORADOL) 30 MG/ML injection 30 mg (has no administration in time range)    Initial Impression / Assessment and  Plan / UC Course  I have reviewed the triage vital signs and the nursing notes.  Pertinent labs & imaging results that were available during my care of the patient were reviewed by me and considered in my medical decision making (see chart for details).        X-rays negative for fracture.  There is some osteoarthritis at the MTP joint.  He is given Toradol and ibuprofen is sent to the pharmacy.  He is given contact information for podiatry, so he can follow-up with them Final Clinical Impressions(s) / UC Diagnoses   Final diagnoses:  Right foot pain     Discharge Instructions      There were no broken bones on your x-ray  You have been given a shot of Toradol 30 mg today.  Take ibuprofen 800 mg--1 tab every 8 hours as needed for pain.       ED Prescriptions     Medication Sig Dispense Auth. Provider   ibuprofen (ADVIL) 800 MG tablet Take 1 tablet (800 mg total) by mouth every 8 (eight) hours as needed (pain). 21 tablet Shyenne Maggard, Gwenlyn Perking, MD      PDMP not reviewed this encounter.   Barrett Henle, MD 01/23/23 1554    Barrett Henle, MD 01/23/23 [phone]

## 2023-01-30 ENCOUNTER — Ambulatory Visit: Payer: Self-pay

## 2023-01-31 ENCOUNTER — Ambulatory Visit: Payer: Self-pay

## 2023-02-01 ENCOUNTER — Ambulatory Visit
Admission: RE | Admit: 2023-02-01 | Discharge: 2023-02-01 | Disposition: A | Discharge status: Home / Self Care | Payer: No Typology Code available for payment source | Source: Ambulatory Visit | Attending: Family Medicine | Admitting: Family Medicine

## 2023-02-01 VITALS — BP 150/88 | HR 54 | Temp 98.0°F | Resp 16

## 2023-02-01 DIAGNOSIS — J029 Acute pharyngitis, unspecified: Secondary | ICD-10-CM

## 2023-02-01 DIAGNOSIS — Z20818 Contact with and (suspected) exposure to other bacterial communicable diseases: Secondary | ICD-10-CM

## 2023-02-01 LAB — POCT RAPID STREP A (OFFICE): Rapid Strep A Screen: NEGATIVE

## 2023-02-01 MED ORDER — AMOXICILLIN 875 MG PO TABS: 875.0000 mg | ORAL_TABLET | Freq: Two times a day (BID) | ORAL | 0 refills | Status: AC

## 2023-02-01 NOTE — Discharge Instructions (Addendum)
You may use over the counter ibuprofen or acetaminophen as needed.  °For a sore throat, over the counter products such as [employer] Peroxyl Mouth Sore Rinse or Chloraseptic Sore Throat Spray may provide some temporary relief. ° ° ° ° °

## 2023-02-01 NOTE — ED Triage Notes (Signed)
Pt states sore throat since yesterday.  States his girlfriend and son have strep.

## 2023-02-01 NOTE — ED Provider Notes (Signed)
  [place]   [id number] 02/01/23 Arrival Time: [id number]  ASSESSMENT & PLAN:  1. Sore throat   2. Exposure to strep throat    Exam suspicious for strep; with exposure; will treat. No signs of peritonsillar abscess. Discussed.  Meds ordered this encounter  Medications   amoxicillin (AMOXIL) 875 MG tablet    Sig: Take 1 tablet (875 mg total) by mouth 2 (two) times daily for 10 days.    Dispense:  20 tablet    Refill:  0   Results for orders placed or performed during the hospital encounter of 02/01/23  POCT rapid strep A  Result Value Ref Range   Rapid Strep A Screen Negative Negative   Labs Reviewed  POCT RAPID STREP A (OFFICE)   OTC analgesics and throat care as needed  Instructed to finish full course of antibiotics. Will follow up if not showing significant improvement over the next 24-48 hours.    Discharge Instructions      You may use over the counter ibuprofen or acetaminophen as needed.  For a sore throat, over the counter products such as [employer] Peroxyl Mouth Sore Rinse or Chloraseptic Sore Throat Spray may provide some temporary relief.       Reviewed expectations re: course of current medical issues. Questions answered. Outlined signs and symptoms indicating need for more acute intervention. Patient verbalized understanding. After Visit Summary given.   SUBJECTIVE:  [name] is a 42 y.o. male who reports a sore throat. abrupt onset yesterday. His girlfriend and son both have strep. No respiratory symptoms. Normal PO intake but reports discomfort with swallowing. No specific alleviating factors. Fever: believed to be present, temp not taken. No neck pain or swelling. No associated nausea, vomiting, or abdominal pain. No tx PTA.   OBJECTIVE:  Vitals:   02/01/23 1703 02/01/23 1705  BP:  (!) 150/88  Pulse: (!) 54   Resp: 16   Temp: 98 F (36.7 C)   TempSrc: Oral   SpO2: 97% 97%    General appearance: alert; no distress HEENT:  throat with moderate erythema and enlarged tonsils; mild exudates; uvula is midline Neck: supple with FROM; small bilat LAD Lungs: speaks full sentences without difficulty; unlabored Abd: soft; non-tender Skin: reveals no rash; warm and dry Psychological: alert and cooperative; normal mood and affect  No Known Allergies  Past Medical History:  Diagnosis Date   Hypertension    Social History   Socioeconomic History   Marital status: Single    Spouse name: Not on file   Number of children: Not on file   Years of education: Not on file   Highest education level: Not on file  Occupational History   Not on file  Tobacco Use   Smoking status: Never   Smokeless tobacco: Never  Substance and Sexual Activity   Alcohol use: No   Drug use: No   Sexual activity: Not on file  Other Topics Concern   Not on file  Social History Narrative   Not on file   Social Determinants of Health   Financial Resource Strain: Not on file  Food Insecurity: Not on file  Transportation Needs: Not on file  Physical Activity: Not on file  Stress: Not on file  Social Connections: Not on file  Intimate Partner Violence: Not on file   Family History  Adopted: Jerre Simon, MD 02/01/23 1734

## 2023-03-01 ENCOUNTER — Ambulatory Visit: Payer: Self-pay

## 2023-04-18 NOTE — Progress Notes (Deleted)
  Subjective:    Race Hider - 42 y.o. male MRN [id number]  Date of birth: 11/22/1981  HPI  Julienne Bernson is to establish care.  Current issues and/or concerns: Blood pressure  Migraines     ROS per HPI     Health Maintenance:  Health Maintenance Due  Topic Date Due   COVID-19 Vaccine (1) Never done   HIV Screening  Never done   Hepatitis C Screening  Never done   DTaP/Tdap/Td (1 - Tdap) Never done     Past Medical History: Patient Active Problem List   Diagnosis Date Noted   Hypertension    BRADYCARDIA 01/18/2009      Social History   reports that he has never smoked. He has never used smokeless tobacco. He reports that he does not drink alcohol and does not use drugs.   Family History  family history is not on file. He was adopted.   Medications: reviewed and updated   Objective:   Physical Exam There were no vitals taken for this visit. Physical Exam      Assessment & Plan:         Patient was given clear instructions to go to Emergency Department or return to medical center if symptoms don't improve, worsen, or new problems develop.The patient verbalized understanding.  I discussed the assessment and treatment plan with the patient. The patient was provided an opportunity to ask questions and all were answered. The patient agreed with the plan and demonstrated an understanding of the instructions.   The patient was advised to call back or seek an in-person evaluation if the symptoms worsen or if the condition fails to improve as anticipated.    Ricky Stabs, NP 04/18/2023, 12:52 PM Primary Care at [facility]

## 2023-04-23 ENCOUNTER — Ambulatory Visit: Payer: Self-pay | Admitting: Family

## 2023-04-23 DIAGNOSIS — Z7689 Persons encountering health services in other specified circumstances: Secondary | ICD-10-CM

## 2023-07-04 ENCOUNTER — Ambulatory Visit: Payer: Self-pay | Admitting: Family

## 2023-07-04 ENCOUNTER — Ambulatory Visit: Payer: Self-pay | Admitting: Family Medicine

## 2023-08-06 ENCOUNTER — Ambulatory Visit: Payer: No Typology Code available for payment source | Admitting: Family Medicine

## 2023-10-03 ENCOUNTER — Ambulatory Visit: Payer: BC Managed Care – PPO | Attending: Obstetrics and Gynecology

## 2023-10-03 DIAGNOSIS — Z31440 Encounter of male for testing for genetic disease carrier status for procreative management: Secondary | ICD-10-CM | POA: Diagnosis not present

## 2023-10-09 ENCOUNTER — Ambulatory Visit (HOSPITAL_BASED_OUTPATIENT_CLINIC_OR_DEPARTMENT_OTHER): Payer: No Typology Code available for payment source | Admitting: Family Medicine

## 2023-10-18 LAB — HORIZON CUSTOM: REPORT SUMMARY: POSITIVE — AB

## 2023-10-22 ENCOUNTER — Ambulatory Visit (HOSPITAL_BASED_OUTPATIENT_CLINIC_OR_DEPARTMENT_OTHER): Payer: No Typology Code available for payment source | Admitting: Family Medicine

## 2023-11-06 ENCOUNTER — Ambulatory Visit (HOSPITAL_BASED_OUTPATIENT_CLINIC_OR_DEPARTMENT_OTHER): Payer: No Typology Code available for payment source | Admitting: Family Medicine

## 2023-11-09 ENCOUNTER — Ambulatory Visit: Payer: Self-pay

## 2023-11-10 ENCOUNTER — Other Ambulatory Visit: Payer: Self-pay

## 2023-11-10 ENCOUNTER — Encounter (HOSPITAL_BASED_OUTPATIENT_CLINIC_OR_DEPARTMENT_OTHER): Payer: Self-pay

## 2023-11-10 DIAGNOSIS — R519 Headache, unspecified: Secondary | ICD-10-CM | POA: Diagnosis not present

## 2023-11-10 DIAGNOSIS — I10 Essential (primary) hypertension: Secondary | ICD-10-CM | POA: Insufficient documentation

## 2023-11-10 DIAGNOSIS — Z79899 Other long term (current) drug therapy: Secondary | ICD-10-CM | POA: Insufficient documentation

## 2023-11-10 NOTE — ED Triage Notes (Signed)
PT to triage c/o headache 10/10 pressure in nature x 1 month without alleviating factors. PT denies injury or fever. No obvious deformity noted. PT VSS NAD PT on room air.

## 2023-11-11 ENCOUNTER — Emergency Department (HOSPITAL_BASED_OUTPATIENT_CLINIC_OR_DEPARTMENT_OTHER): Payer: BC Managed Care – PPO

## 2023-11-11 ENCOUNTER — Emergency Department (HOSPITAL_BASED_OUTPATIENT_CLINIC_OR_DEPARTMENT_OTHER)
Admission: EM | Admit: 2023-11-11 | Discharge: 2023-11-11 | Disposition: A | Discharge status: Home / Self Care | Payer: BC Managed Care – PPO | Attending: Emergency Medicine | Admitting: Emergency Medicine

## 2023-11-11 ENCOUNTER — Ambulatory Visit: Payer: Self-pay

## 2023-11-11 DIAGNOSIS — G8929 Other chronic pain: Secondary | ICD-10-CM

## 2023-11-11 DIAGNOSIS — I10 Essential (primary) hypertension: Secondary | ICD-10-CM

## 2023-11-11 DIAGNOSIS — R519 Headache, unspecified: Secondary | ICD-10-CM | POA: Diagnosis not present

## 2023-11-11 MED ORDER — DEXAMETHASONE SODIUM PHOSPHATE 10 MG/ML IJ SOLN
10.0000 mg | Freq: Once | INTRAMUSCULAR | Status: AC
  Administered 2023-11-11: 10 mg via INTRAVENOUS
  Filled 2023-11-11: qty 1

## 2023-11-11 MED ORDER — METOCLOPRAMIDE HCL 5 MG/ML IJ SOLN
10.0000 mg | Freq: Once | INTRAMUSCULAR | Status: AC
  Administered 2023-11-11: 10 mg via INTRAVENOUS
  Filled 2023-11-11: qty 2

## 2023-11-11 MED ORDER — LISINOPRIL 10 MG PO TABS
10.0000 mg | ORAL_TABLET | Freq: Once | ORAL | Status: AC
  Administered 2023-11-11: 10 mg via ORAL
  Filled 2023-11-11: qty 1

## 2023-11-11 MED ORDER — LISINOPRIL 10 MG PO TABS: 10.0000 mg | ORAL_TABLET | Freq: Every day | ORAL | 1 refills | Status: DC

## 2023-11-11 MED ORDER — KETOROLAC TROMETHAMINE 30 MG/ML IJ SOLN
15.0000 mg | Freq: Once | INTRAMUSCULAR | Status: AC
  Administered 2023-11-11: 15 mg via INTRAVENOUS
  Filled 2023-11-11: qty 1

## 2023-11-11 MED ORDER — DIPHENHYDRAMINE HCL 50 MG/ML IJ SOLN
25.0000 mg | Freq: Once | INTRAMUSCULAR | Status: AC
  Administered 2023-11-11: 25 mg via INTRAVENOUS
  Filled 2023-11-11: qty 1

## 2023-11-11 NOTE — ED Notes (Signed)
ED Provider at bedside. 

## 2023-11-11 NOTE — ED Provider Notes (Signed)
[place] EMERGENCY DEPARTMENT AT [facility] Provider Note   CSN: [id number] Arrival date & time: 11/10/23  2240     History  Chief Complaint  Patient presents with   Headache    Allyson Holzhausen is a 42 y.o. male.  42 year old male with a past history of untreated hypertension who presents ER today secondary to headache.  Patient initially said that this was new and have been going on for a month and progressively worsening so CT was done.  Subsequently patient stated that he had headaches before and actually been to the ER and had a headache cocktail.  Patient states no neurologic changes.  No recent fevers, illnesses, trauma.  No associated symptoms such as vision changes, neurologic changes, chest pain, back pain, abdominal pain, shortness of breath or lower extremity edema.  States does not taking medications and is not supposed to however it turns out he is been on lisinopril in the past.  Not sure when he stopped that.   Headache      Home Medications Prior to Admission medications   Medication Sig Start Date End Date Taking? Authorizing Provider  lisinopril (ZESTRIL) 10 MG tablet Take 1 tablet (10 mg total) by mouth daily. 11/11/23  Yes Bernon Arviso, Barbara Cower, MD  lidocaine (XYLOCAINE) 2 % solution As needed to affected area 05/18/20   Belinda Fisher, PA-C      Allergies    Patient has no known allergies.    Review of Systems   Review of Systems  Neurological:  Positive for headaches.    Physical Exam Updated Vital Signs BP (!) 163/104   Pulse (!) 44   Temp 98.3 F (36.8 C) (Oral)   Resp 16   Wt 83.9 kg   SpO2 99%   BMI 23.12 kg/m  Physical Exam Vitals and nursing note reviewed.  Constitutional:      Appearance: He is well-developed.  HENT:     Head: Normocephalic and atraumatic.  Cardiovascular:     Rate and Rhythm: Normal rate.  Pulmonary:     Effort: Pulmonary effort is normal. No respiratory distress.  Abdominal:     General: There is no distension.   Musculoskeletal:        General: Normal range of motion.     Cervical back: Normal range of motion.  Neurological:     Mental Status: He is alert.     Comments: No altered mental status, able to give full seemingly accurate history.  Face is symmetric, EOM's intact, pupils equal and reactive, vision intact, tongue and uvula midline without deviation. Upper and Lower extremity motor 5/5, intact pain perception in distal extremities, 2+ reflexes in biceps, patella and achilles tendons. Able to perform finger to nose normal with both hands. Walks without assistance or evident ataxia.       ED Results / Procedures / Treatments   Labs (all labs ordered are listed, but only abnormal results are displayed) Labs Reviewed - No data to display  EKG None  Radiology CT Head Wo Contrast  Result Date: 11/11/2023 CLINICAL DATA:  Headache. EXAM: CT HEAD WITHOUT CONTRAST TECHNIQUE: Contiguous axial images were obtained from the base of the skull through the vertex without intravenous contrast. RADIATION DOSE REDUCTION: This exam was performed according to the departmental dose-optimization program which includes automated exposure control, adjustment of the mA and/or kV according to patient size and/or use of iterative reconstruction technique. COMPARISON:  None Available. FINDINGS: Brain: No evidence of acute infarction, hemorrhage, hydrocephalus, extra-axial collection  or mass lesion/mass effect. Vascular: No hyperdense vessel or unexpected calcification. Skull: Normal. Negative for fracture or focal lesion. Sinuses/Orbits: No acute finding. Other: None. IMPRESSION: No acute intracranial pathology. Electronically Signed   By: Aram Candela M.D.   On: 11/11/2023 01:24    Procedures Procedures    Medications Ordered in ED Medications  ketorolac (TORADOL) 30 MG/ML injection 15 mg (15 mg Intravenous Given 11/11/23 0241)  metoCLOPramide (REGLAN) injection 10 mg (10 mg Intravenous Given 11/11/23 0240)   diphenhydrAMINE (BENADRYL) injection 25 mg (25 mg Intravenous Given 11/11/23 0239)  dexamethasone (DECADRON) injection 10 mg (10 mg Intravenous Given 11/11/23 0237)  lisinopril (ZESTRIL) tablet 10 mg (10 mg Oral Given 11/11/23 0355)    ED Course/ Medical Decision Making/ A&P                                 Medical Decision Making Amount and/or Complexity of Data Reviewed Radiology: ordered.  Risk Prescription drug management.   Initially it seemed like the patient had a new headache and new elevated blood pressures with bradycardia.  CT head was done to ensure no space-occupying lesions or bleed.  This was negative.  Headache cocktail provided which helped his headache and his blood pressure remained high.  When discussing with him and his fiance it turns out that he had been on lisinopril in the past and had stopped it.  Will restart the same.  He has a PCP follow-up appointment January 9 here.  Will keep a log until that time.  No evidence of endorgan damage related to his blood pressure at this time.  No indication for labs.  Final Clinical Impression(s) / ED Diagnoses Final diagnoses:  Hypertension, unspecified type  Chronic nonintractable headache, unspecified headache type    Rx / DC Orders ED Discharge Orders          Ordered    lisinopril (ZESTRIL) 10 MG tablet  Daily        11/11/23 0348              Anushka Hartinger, Barbara Cower, MD 11/11/23 0505

## 2023-11-13 ENCOUNTER — Ambulatory Visit: Payer: Self-pay

## 2023-11-23 ENCOUNTER — Ambulatory Visit
Admission: RE | Admit: 2023-11-23 | Discharge: 2023-11-23 | Disposition: A | Discharge status: Home / Self Care | Payer: BC Managed Care – PPO | Source: Ambulatory Visit | Attending: Emergency Medicine | Admitting: Emergency Medicine

## 2023-11-23 VITALS — BP 152/96 | HR 52 | Temp 98.7°F | Resp 16 | Ht 75.0 in | Wt 175.0 lb

## 2023-11-23 DIAGNOSIS — K047 Periapical abscess without sinus: Secondary | ICD-10-CM | POA: Diagnosis not present

## 2023-11-23 MED ORDER — PENICILLIN V POTASSIUM 500 MG PO TABS: 500.0000 mg | ORAL_TABLET | Freq: Three times a day (TID) | ORAL | 0 refills | Status: AC

## 2023-11-23 MED ORDER — PENICILLIN V POTASSIUM 500 MG PO TABS: 500.0000 mg | ORAL_TABLET | Freq: Three times a day (TID) | ORAL | 0 refills | Status: DC

## 2023-11-23 MED ORDER — IBUPROFEN 800 MG PO TABS: 800.0000 mg | ORAL_TABLET | Freq: Three times a day (TID) | ORAL | 0 refills | Status: DC | PRN

## 2023-11-23 NOTE — ED Provider Notes (Signed)
[name]    CSN: [id number] Arrival date & time: 11/23/23  1824    HISTORY   Chief Complaint  Patient presents with   Abscess    Tooth pain - Entered by patient   HPI [name] is a pleasant, 42 y.o. male who presents to urgent care today. Patient complains of toothache, location on right bottom molar x 2 weeks. Patient states he has an appointment with a dentist in January to have the tooth filled.  Patient states he was advised to take Tylenol, has tried taking Tylenol without relief.  Patient denies fever, difficulty swallowing, throat pain, headache, facial swelling.  The history is provided by the patient.   Past Medical History:  Diagnosis Date   Hypertension    Patient Active Problem List   Diagnosis Date Noted   Hypertension    BRADYCARDIA 01/18/2009   Past Surgical History:  Procedure Laterality Date   NO PAST SURGERIES      Home Medications    Prior to Admission medications   Medication Sig Start Date End Date Taking? Authorizing Provider  ibuprofen (ADVIL) 800 MG tablet Take 1 tablet (800 mg total) by mouth every 8 (eight) hours as needed for up to 21 doses for fever, headache, mild pain (pain score 1-3) or moderate pain (pain score 4-6). 11/23/23  Yes Theadora Rama Scales, PA-C  penicillin v potassium (VEETID) 500 MG tablet Take 1 tablet (500 mg total) by mouth 3 (three) times daily for 14 days. 11/23/23 12/07/23 Yes Theadora Rama Scales, PA-C  lidocaine (XYLOCAINE) 2 % solution As needed to affected area 05/18/20   Cathie Hoops, Amy V, PA-C  lisinopril (ZESTRIL) 10 MG tablet Take 1 tablet (10 mg total) by mouth daily. 11/11/23   Mesner, Barbara Cower, MD    Family History Family History  Adopted: Yes   Social History Social History   Tobacco Use   Smoking status: Never   Smokeless tobacco: Never  Substance Use Topics   Alcohol use: No   Drug use: No   Allergies   Patient has no known allergies.  Review of Systems Review of  Systems Pertinent findings revealed after performing a 14 point review of systems has been noted in the history of present illness.  Physical Exam Vital Signs Temp 98.7 F (37.1 C) (Oral)   Ht 6\' 3"  (1.905 m)   Wt 175 lb (79.4 kg)   BMI 21.87 kg/m   No data found.  Physical Exam Vitals and nursing note reviewed.  Constitutional:      General: He is not in acute distress.    Appearance: Normal appearance. He is normal weight. He is not ill-appearing.  HENT:     Head: Normocephalic and atraumatic.     Mouth/Throat:     Lips: Pink.     Mouth: Mucous membranes are moist.     Dentition: Dental caries and dental abscesses present.  Eyes:     Extraocular Movements: Extraocular movements intact.     Conjunctiva/sclera: Conjunctivae normal.     Pupils: Pupils are equal, round, and reactive to light.  Cardiovascular:     Rate and Rhythm: Normal rate and regular rhythm.  Pulmonary:     Effort: Pulmonary effort is normal.     Breath sounds: Normal breath sounds.  Musculoskeletal:        General: Normal range of motion.     Cervical back: Normal range of motion and neck supple.  Skin:    General: Skin is warm and dry.  Neurological:     General: No focal deficit present.     Mental Status: He is alert and oriented to person, place, and time. Mental status is at baseline.  Psychiatric:        Mood and Affect: Mood normal.        Behavior: Behavior normal.        Thought Content: Thought content normal.        Judgment: Judgment normal.     Visual Acuity Right Eye Distance:   Left Eye Distance:   Bilateral Distance:    Right Eye Near:   Left Eye Near:    Bilateral Near:     UC Couse / Diagnostics / Procedures:     Radiology No results found.  Procedures Procedures (including critical care time) EKG  Pending results:  Labs Reviewed - No data to display  Medications Ordered in UC: Medications - No data to display  UC Diagnoses / Final Clinical Impressions(s)    I have reviewed the triage vital signs and the nursing notes.  Pertinent labs & imaging results that were available during my care of the patient were reviewed by me and considered in my medical decision making (see chart for details).    Final diagnoses:  Dental abscess   Patient provided with a 14-day course of penicillin for empiric treatment of presumed dental abscess.  Patient provided with a prescription for ibuprofen 100 mg for pain relief.  Conservative care recommended.  Return precautions advised.  Please see discharge instructions below for details of plan of care as provided to patient. ED Prescriptions     Medication Sig Dispense Auth. Provider   penicillin v potassium (VEETID) 500 MG tablet Take 1 tablet (500 mg total) by mouth 3 (three) times daily for 14 days. 42 tablet Theadora Rama Scales, PA-C   ibuprofen (ADVIL) 800 MG tablet Take 1 tablet (800 mg total) by mouth every 8 (eight) hours as needed for up to 21 doses for fever, headache, mild pain (pain score 1-3) or moderate pain (pain score 4-6). 21 tablet Theadora Rama Scales, PA-C      PDMP not reviewed this encounter.  Pending results:  Labs Reviewed - No data to display    Discharge Instructions      For relief of pain and infection of your tooth, please begin penicillin 500 mg tablets 3 times daily for the next 14 days.  Please be sure that you complete the entire prescription for best results.  For pain, I provided you with a prescription for urinary milligram tablets that you can also take 3 times daily as needed.  It is very important that you follow-up with a dentist to have the tooth addressed.  Failure to do so will result in the the tooth becoming infected again.  Dental infections also put you at increased risk of infection in the valves of your heart.  Thank you for visiting [facility] today.  We appreciate the opportunity to participate in your care.    Disposition Upon Discharge:   Condition: stable for discharge home  Patient presented with an acute illness with associated systemic symptoms and significant discomfort requiring urgent management. In my opinion, this is a condition that a prudent lay person (someone who possesses an average knowledge of health and medicine) may potentially expect to result in complications if not addressed urgently such as respiratory distress, impairment of bodily function or dysfunction of bodily organs.   Routine symptom specific, illness specific and/or  disease specific instructions were discussed with the patient and/or caregiver at length.   As such, the patient has been evaluated and assessed, work-up was performed and treatment was provided in alignment with urgent care protocols and evidence based medicine.  Patient/parent/caregiver has been advised that the patient may require follow up for further testing and treatment if the symptoms continue in spite of treatment, as clinically indicated and appropriate.  Patient/parent/caregiver has been advised to return to the [facility] or PCP if no better; to PCP or the Emergency Department if new signs and symptoms develop, or if the current signs or symptoms continue to change or worsen for further workup, evaluation and treatment as clinically indicated and appropriate  The patient will follow up with their current PCP if and as advised. If the patient does not currently have a PCP we will assist them in obtaining one.   The patient may need specialty follow up if the symptoms continue, in spite of conservative treatment and management, for further workup, evaluation, consultation and treatment as clinically indicated and appropriate.  Patient/parent/caregiver verbalized understanding and agreement of plan as discussed.  All questions were addressed during visit.  Please see discharge instructions below for further details of plan.  This office note has been dictated using Engineer, structural.  Unfortunately, this method of dictation can sometimes lead to typographical or grammatical errors.  I apologize for your inconvenience in advance if this occurs.  Please do not hesitate to reach out to me if clarification is needed.      Theadora Rama Scales, [place] 11/23/23 2205

## 2023-11-23 NOTE — ED Triage Notes (Signed)
Patient presents with a toothache, location on right bottom side x 2 weeks. Patient states he went to dentist and was referred to a provider to get a fill, appointment is in January. Treated with Tylenol without relief.

## 2023-11-23 NOTE — Discharge Instructions (Addendum)
For relief of pain and infection of your tooth, please begin penicillin 500 mg tablets 3 times daily for the next 14 days.  Please be sure that you complete the entire prescription for best results.  For pain, I provided you with a prescription for urinary milligram tablets that you can also take 3 times daily as needed.  It is very important that you follow-up with a dentist to have the tooth addressed.  Failure to do so will result in the the tooth becoming infected again.  Dental infections also put you at increased risk of infection in the valves of your heart.  Thank you for visiting [facility] today.  We appreciate the opportunity to participate in your care.

## 2023-12-20 ENCOUNTER — Ambulatory Visit (HOSPITAL_BASED_OUTPATIENT_CLINIC_OR_DEPARTMENT_OTHER): Payer: No Typology Code available for payment source | Admitting: Family Medicine

## 2023-12-26 ENCOUNTER — Ambulatory Visit
Admission: RE | Admit: 2023-12-26 | Discharge: 2023-12-26 | Disposition: A | Discharge status: Home / Self Care | Payer: BC Managed Care – PPO | Source: Ambulatory Visit | Attending: Physician Assistant | Admitting: Physician Assistant

## 2023-12-26 VITALS — BP 148/95 | HR 51 | Temp 98.1°F | Resp 16 | Ht 75.0 in | Wt 180.0 lb

## 2023-12-26 DIAGNOSIS — K0889 Other specified disorders of teeth and supporting structures: Secondary | ICD-10-CM

## 2023-12-26 DIAGNOSIS — K047 Periapical abscess without sinus: Secondary | ICD-10-CM

## 2023-12-26 DIAGNOSIS — R220 Localized swelling, mass and lump, head: Secondary | ICD-10-CM | POA: Diagnosis not present

## 2023-12-26 MED ORDER — LIDOCAINE VISCOUS HCL 2 % MT SOLN: OROMUCOSAL | 0 refills | Status: DC

## 2023-12-26 MED ORDER — AMOXICILLIN-POT CLAVULANATE 875-125 MG PO TABS: 1.0000 | ORAL_TABLET | Freq: Two times a day (BID) | ORAL | 0 refills | Status: DC

## 2023-12-26 MED ORDER — IBUPROFEN 800 MG PO TABS: 800.0000 mg | ORAL_TABLET | Freq: Three times a day (TID) | ORAL | 0 refills | Status: DC | PRN

## 2023-12-26 NOTE — Discharge Instructions (Signed)
 Start Augmentin  twice daily for 7 days.  Use ibuprofen  800 mg up to 3 times a day for pain relief.  You should avoid NSAIDs including aspirin, ibuprofen /Advil , naproxen /Aleve  with this medication as it causes stomach bleeding.  You can use Tylenol  for breakthrough pain.  Gargle with warm salt water for additional symptom relief.  I have also called in viscous lidocaine  to help with pain relief.  Gargle and spit this out up to twice a day as needed.  Do not eat or drink immediately after using this medication as it increases the risk of choking.  You should follow-up with dentist; call to schedule an appointment.  If you develop any worsening symptoms including difficulty swallowing, difficulty speaking, swelling of your throat, high fever, change in your voice you need to be seen immediately.

## 2023-12-26 NOTE — ED Provider Notes (Signed)
 [name]    CSN: [id number] Arrival date & time: 12/26/23  1750      History   Chief Complaint Chief Complaint  Patient presents with   Abscess    Tooth  causing pain and headache - Entered by patient    HPI [name] is a 43 y.o. male.   Patient presents today with a prolonged history (many months) of intermittent dentalgia with associated swelling.  He was seen by our clinic 1 month ago at which point he was prescribed penicillin  and ibuprofen .  This provided temporary improvement of symptoms only to have it recur as soon as he stopped the antibiotic.  He has a known broken tooth as well as a missing tooth in this area and has been told that his wisdom tooth is growing in an abnormal angle.  He was evaluated by a dentist in January but told that he would need to have $1700 to begin the work that was necessary which he could not afford.  He is in the process of trying to see another dentist but has not been able to do so yet.  He denies any recent dental procedure.  Denies any swelling with throat, shortness of breath, muffled voice, fever.  Pain is rated 9 on a 0-10 pain scale, described as sharp, worse with mastication, no alleviating factors identified, with radiation into jaw and towards ear.    Past Medical History:  Diagnosis Date   Hypertension     Patient Active Problem List   Diagnosis Date Noted   Hypertension    BRADYCARDIA 01/18/2009    Past Surgical History:  Procedure Laterality Date   NO PAST SURGERIES         Home Medications    Prior to Admission medications   Medication Sig Start Date End Date Taking? Authorizing Provider  amoxicillin -clavulanate (AUGMENTIN ) 875-125 MG tablet Take 1 tablet by mouth every 12 (twelve) hours. 12/26/23  Yes Lavonte Palos K, PA-C  lisinopril  (ZESTRIL ) 10 MG tablet Take 1 tablet (10 mg total) by mouth daily. 11/11/23  Yes Mesner, Reymundo Caulk, MD  ibuprofen  (ADVIL ) 800 MG tablet Take 1 tablet (800 mg total) by  mouth every 8 (eight) hours as needed for up to 21 doses. 12/26/23   Kasie Leccese K, PA-C  lidocaine  (XYLOCAINE ) 2 % solution Swish and spit up as needed twice daily 12/26/23   Latoya Diskin K, PA-C    Family History Family History  Adopted: Yes  Problem Relation Age of Onset   Healthy Mother     Social History Social History   Tobacco Use   Smoking status: Never   Smokeless tobacco: Never  Vaping Use   Vaping status: Never Used  Substance Use Topics   Alcohol use: No   Drug use: No     Allergies   Patient has no known allergies.   Review of Systems Review of Systems  Constitutional:  Positive for activity change. Negative for appetite change, fatigue and fever.  HENT:  Positive for dental problem, ear pain and facial swelling. Negative for sore throat, trouble swallowing and voice change.   Respiratory:  Negative for cough and shortness of breath.   Cardiovascular:  Negative for chest pain.  Gastrointestinal:  Negative for abdominal pain, diarrhea, nausea and vomiting.     Physical Exam Triage Vital Signs ED Triage Vitals  Encounter Vitals Group     BP 12/26/23 1830 (!) 148/95     Systolic BP Percentile --  Diastolic BP Percentile --      Pulse Rate 12/26/23 1830 (!) 51     Resp 12/26/23 1830 16     Temp 12/26/23 1830 98.1 F (36.7 C)     Temp Source 12/26/23 1830 Oral     SpO2 12/26/23 1830 96 %     Weight 12/26/23 1831 180 lb (81.6 kg)     Height 12/26/23 1831 6\' 3"  (1.905 m)     Head Circumference --      Peak Flow --      Pain Score 12/26/23 1831 9     Pain Loc --      Pain Education --      Exclude from Growth Chart --    No data found.  Updated Vital Signs BP (!) 148/95 (BP Location: Right Arm)   Pulse (!) 51   Temp 98.1 F (36.7 C) (Oral)   Resp 16   Ht 6\' 3"  (1.905 m)   Wt 180 lb (81.6 kg)   SpO2 96%   BMI 22.50 kg/m   Visual Acuity Right Eye Distance:   Left Eye Distance:   Bilateral Distance:    Right Eye Near:   Left Eye  Near:    Bilateral Near:     Physical Exam Vitals reviewed.  Constitutional:      General: He is awake.     Appearance: Normal appearance. He is well-developed. He is not ill-appearing.     Comments: Very pleasant male appears stated age in no acute distress sitting comfortably in exam room  HENT:     Head: Normocephalic and atraumatic.     Right Ear: External ear normal.     Left Ear: External ear normal.     Nose: Nose normal.     Mouth/Throat:     Dentition: Abnormal dentition. Dental tenderness, gingival swelling and dental abscesses present.     Pharynx: Uvula midline. No oropharyngeal exudate, posterior oropharyngeal erythema or uvula swelling.      Comments: No evidence of Ludwig angina Cardiovascular:     Rate and Rhythm: Normal rate and regular rhythm.     Heart sounds: Normal heart sounds, S1 normal and S2 normal. No murmur heard. Pulmonary:     Effort: Pulmonary effort is normal. No accessory muscle usage or respiratory distress.     Breath sounds: Normal breath sounds. No stridor. No wheezing, rhonchi or rales.     Comments: Clear to auscultation bilaterally Lymphadenopathy:     Head:     Right side of head: No submental, submandibular or tonsillar adenopathy.     Left side of head: No submental, submandibular or tonsillar adenopathy.     Cervical: No cervical adenopathy.  Neurological:     Mental Status: He is alert.  Psychiatric:        Behavior: Behavior is cooperative.      UC Treatments / Results  Labs (all labs ordered are listed, but only abnormal results are displayed) Labs Reviewed - No data to display  EKG   Radiology No results found.  Procedures Procedures (including critical care time)  Medications Ordered in UC Medications - No data to display  Initial Impression / Assessment and Plan / UC Course  I have reviewed the triage vital signs and the nursing notes.  Pertinent labs & imaging results that were available during my care of the  patient were reviewed by me and considered in my medical decision making (see chart for details).     Patient  is well-appearing, afebrile, nontoxic, nontachycardic.  No indication for emergent evaluation or imaging.  Patient was treated for dental infection with Augmentin  twice daily for 7 days.  He was given ibuprofen  800 mg for pain relief with instruction to take this with food to prevent GI upset.  He is to avoid use of additional NSAIDs.  Can use Tylenol  for breakthrough pain.  Recommend he gargle with warm salt water for additional symptom relief.  He was given viscous lidocaine  for additional symptom relief with instruction not to eat or drink immediately after using this medication due to increased risk of choking.  Discussed that ultimately he will need to see dentist to address underlying tooth.  He was provided the contact information for low-cost dentist in the area as part of his after visit summary.  If he develops any worsening symptoms including fever, nausea, vomiting, swelling of his throat, muffled voice, dysphagia he needs to go to the emergency room to which he expressed understanding.  Work excuse note provided.   Final Clinical Impressions(s) / UC Diagnoses   Final diagnoses:  Dental infection  Facial swelling  Dentalgia     Discharge Instructions      Start Augmentin  twice daily for 7 days.  Use ibuprofen  800 mg up to 3 times a day for pain relief.  You should avoid NSAIDs including aspirin, ibuprofen /Advil , naproxen /Aleve  with this medication as it causes stomach bleeding.  You can use Tylenol  for breakthrough pain.  Gargle with warm salt water for additional symptom relief.  I have also called in viscous lidocaine  to help with pain relief.  Gargle and spit this out up to twice a day as needed.  Do not eat or drink immediately after using this medication as it increases the risk of choking.  You should follow-up with dentist; call to schedule an appointment.  If you  develop any worsening symptoms including difficulty swallowing, difficulty speaking, swelling of your throat, high fever, change in your voice you need to be seen immediately.      ED Prescriptions     Medication Sig Dispense Auth. Provider   ibuprofen  (ADVIL ) 800 MG tablet Take 1 tablet (800 mg total) by mouth every 8 (eight) hours as needed for up to 21 doses. 21 tablet Zev Blue K, PA-C   lidocaine  (XYLOCAINE ) 2 % solution Swish and spit up as needed twice daily 50 mL Adrina Armijo K, PA-C   amoxicillin -clavulanate (AUGMENTIN ) 875-125 MG tablet Take 1 tablet by mouth every 12 (twelve) hours. 14 tablet Lyfe Monger K, PA-C      PDMP not reviewed this encounter.   Budd Cargo, PA-C 12/26/23 1853

## 2023-12-26 NOTE — ED Triage Notes (Signed)
 Patient c/o continued right bottom tooth pain for several months.  Unable to get an appt with a dentist until February.  Patient denies any OTC pain meds.

## 2024-03-27 ENCOUNTER — Ambulatory Visit: Payer: Self-pay

## 2024-04-07 ENCOUNTER — Ambulatory Visit
Admission: RE | Admit: 2024-04-07 | Discharge: 2024-04-07 | Disposition: A | Discharge status: Home / Self Care | Payer: Self-pay | Source: Ambulatory Visit | Attending: Family Medicine | Admitting: Family Medicine

## 2024-04-07 VITALS — BP 137/71 | HR 63 | Temp 98.7°F | Resp 16 | Wt 179.9 lb

## 2024-04-07 DIAGNOSIS — N3001 Acute cystitis with hematuria: Secondary | ICD-10-CM | POA: Insufficient documentation

## 2024-04-07 LAB — POCT URINALYSIS DIP (MANUAL ENTRY)
Bilirubin, UA: NEGATIVE
Glucose, UA: NEGATIVE mg/dL
Ketones, POC UA: NEGATIVE mg/dL
Nitrite, UA: NEGATIVE
Protein Ur, POC: 30 mg/dL — AB
Spec Grav, UA: >=1.03 — AB
Urobilinogen, UA: 0.2 U/dL
pH, UA: 6

## 2024-04-07 LAB — POCT FASTING CBG KUC MANUAL ENTRY: POCT Glucose (KUC): 114 mg/dL — AB (ref 70–99)

## 2024-04-07 MED ORDER — CIPROFLOXACIN HCL 500 MG PO TABS: 500.0000 mg | ORAL_TABLET | Freq: Two times a day (BID) | ORAL | 0 refills | Status: AC

## 2024-04-07 NOTE — ED Provider Notes (Signed)
 [name]    CSN: [id number] Arrival date & time: 04/07/24  1531      History   Chief Complaint Chief Complaint  Patient presents with   Urinary Frequency    Pain - Entered by patient    HPI [name] is a 43 y.o. male.  Patient here with two days of urine frequency and lower abdominal pain.  No other symptoms.  No history of UTI or diabetes. Patient is unaware of his family history as he is adopted. He has not had a complete physical exam establish with a PCP.  He routinely obtains care through urgent care or ED days. Patient has not seen any blood in his urine but endorses that he has seen dark cloudy urine when he urinates.  Patient endorses some lower abdominal pain when the urge to urinate develops.  He reports that he has urgency with urination to the point that he is unable to hold the urine to get to the restroom in a timely manner. Symptoms have worsened over the last 2 days.  Denies flank pain. Patient denies any possibility of STD. Past Medical History:  Diagnosis Date   Hypertension     Patient Active Problem List   Diagnosis Date Noted   Hypertension    BRADYCARDIA 01/18/2009    Past Surgical History:  Procedure Laterality Date   NO PAST SURGERIES         Home Medications    Prior to Admission medications   Medication Sig Start Date End Date Taking? Authorizing Provider  ciprofloxacin  (CIPRO ) 500 MG tablet Take 1 tablet (500 mg total) by mouth 2 (two) times daily for 7 days. 04/07/24 04/14/24 Yes Buena Carmine, NP  amoxicillin -clavulanate (AUGMENTIN ) 875-125 MG tablet Take 1 tablet by mouth every 12 (twelve) hours. 12/26/23   Raspet, Erin K, PA-C  ibuprofen  (ADVIL ) 800 MG tablet Take 1 tablet (800 mg total) by mouth every 8 (eight) hours as needed for up to 21 doses. 12/26/23   Raspet, Erin K, PA-C  lidocaine  (XYLOCAINE ) 2 % solution Swish and spit up as needed twice daily 12/26/23   Raspet, Erin K, PA-C  lisinopril  (ZESTRIL ) 10 MG tablet  Take 1 tablet (10 mg total) by mouth daily. 11/11/23   Mesner, Reymundo Caulk, MD    Family History Family History  Adopted: Yes  Problem Relation Age of Onset   Healthy Mother     Social History Social History   Tobacco Use   Smoking status: Never   Smokeless tobacco: Never  Vaping Use   Vaping status: Never Used  Substance Use Topics   Alcohol use: No   Drug use: No     Allergies   Patient has no known allergies.   Review of Systems Review of Systems  Genitourinary:  Positive for frequency.     Physical Exam Triage Vital Signs ED Triage Vitals  Encounter Vitals Group     BP 04/07/24 1616 137/71     Systolic BP Percentile --      Diastolic BP Percentile --      Pulse Rate 04/07/24 1616 63     Resp 04/07/24 1616 16     Temp 04/07/24 1616 98.7 F (37.1 C)     Temp Source 04/07/24 1616 Oral     SpO2 04/07/24 1616 96 %     Weight 04/07/24 1616 179 lb 14.3 oz (81.6 kg)     Height --      Head Circumference --  Peak Flow --      Pain Score 04/07/24 1615 9     Pain Loc --      Pain Education --      Exclude from Growth Chart --    No data found.  Updated Vital Signs BP 137/71 (BP Location: Left Arm)   Pulse 63   Temp 98.7 F (37.1 C) (Oral)   Resp 16   Wt 179 lb 14.3 oz (81.6 kg)   SpO2 96%   BMI 22.49 kg/m   Visual Acuity Right Eye Distance:   Left Eye Distance:   Bilateral Distance:    Right Eye Near:   Left Eye Near:    Bilateral Near:     Physical Exam Vitals reviewed.  Constitutional:      Appearance: Normal appearance.  HENT:     Head: Normocephalic and atraumatic.     Nose: Nose normal.  Eyes:     Extraocular Movements: Extraocular movements intact.     Pupils: Pupils are equal, round, and reactive to light.  Cardiovascular:     Rate and Rhythm: Normal rate and regular rhythm.  Pulmonary:     Effort: Pulmonary effort is normal.     Breath sounds: Normal breath sounds.  Musculoskeletal:     Cervical back: Normal range of motion.   Skin:    General: Skin is warm and dry.  Neurological:     General: No focal deficit present.     Mental Status: He is alert and oriented to person, place, and time.      UC Treatments / Results  Labs (all labs ordered are listed, but only abnormal results are displayed) Labs Reviewed  POCT URINALYSIS DIP (MANUAL ENTRY) - Abnormal; Notable for the following components:      Result Value   Clarity, UA cloudy (*)    Spec Grav, UA >=1.030 (*)    Blood, UA small (*)    Protein Ur, POC =30 (*)    Leukocytes, UA Small (1+) (*)    All other components within normal limits  POCT FASTING CBG [facility] MANUAL ENTRY - Abnormal; Notable for the following components:   POCT Glucose ([facility]) 114 (*)    All other components within normal limits  URINE CULTURE    EKG   Radiology No results found.  Procedures Procedures (including critical care time)  Medications Ordered in UC Medications - No data to display  Initial Impression / Assessment and Plan / UC Course  I have reviewed the triage vital signs and the nursing notes.  Pertinent labs & imaging results that were available during my care of the patient were reviewed by me and considered in my medical decision making (see chart for details).   Patient with minimal contributory medical history presents with 2 days of frequency, urgency and lower abdominal pressure.  Obtained a glucose level was 114 no known history of diabetes.  Glucose is non-fasting. Discussed with patient there could be many causes for current symptoms  therefore he warrants a full physical exam to rule out any underlying chronic conditions that may be causing today's symptoms.  Concerning for UTI and current symptoms this time with that of a UTI.  Obtained a urine culture and patient was scheduled for primary care appointment prior to discharge.  Return precautions given if symptoms worsen or do not improve. Final Clinical Impressions(s) / UC Diagnoses   Final  diagnoses:  Acute cystitis with hematuria     Discharge Instructions  Office will notify you if your urine culture is abnormal.  Start Cipro  take twice daily for 7 days.  If at any point your symptoms worsen or become severe return for reevaluation or go to the nearest emergency department.     ED Prescriptions     Medication Sig Dispense Auth. Provider   ciprofloxacin  (CIPRO ) 500 MG tablet Take 1 tablet (500 mg total) by mouth 2 (two) times daily for 7 days. 14 tablet Buena Carmine, NP      PDMP not reviewed this encounter.   Buena Carmine, NP 04/07/24 [phone]

## 2024-04-07 NOTE — ED Triage Notes (Signed)
 Pt c/o urinary frequency x 2 days. Sxs include lower abdominal pain and frequent urination.

## 2024-04-07 NOTE — Discharge Instructions (Addendum)
 Office will notify you if your urine culture is abnormal.  Start Cipro  take twice daily for 7 days.  If at any point your symptoms worsen or become severe return for reevaluation or go to the nearest emergency department.

## 2024-04-08 LAB — URINE CULTURE
Culture: NO GROWTH
Special Requests: NORMAL

## 2024-04-16 ENCOUNTER — Ambulatory Visit: Payer: Self-pay

## 2024-04-23 ENCOUNTER — Ambulatory Visit: Payer: Self-pay

## 2024-05-20 ENCOUNTER — Encounter: Payer: Self-pay | Admitting: Neurology

## 2024-05-20 ENCOUNTER — Ambulatory Visit: Admitting: Family Medicine

## 2024-05-20 VITALS — BP 130/79 | HR 76 | Temp 97.9°F | Ht 75.0 in | Wt 197.2 lb

## 2024-05-20 DIAGNOSIS — I10 Essential (primary) hypertension: Secondary | ICD-10-CM | POA: Diagnosis not present

## 2024-05-20 DIAGNOSIS — G43E09 Chronic migraine with aura, not intractable, without status migrainosus: Secondary | ICD-10-CM

## 2024-05-20 DIAGNOSIS — R519 Headache, unspecified: Secondary | ICD-10-CM

## 2024-05-20 DIAGNOSIS — G4440 Drug-induced headache, not elsewhere classified, not intractable: Secondary | ICD-10-CM

## 2024-05-20 MED ORDER — SUMATRIPTAN SUCCINATE 50 MG PO TABS: 50.0000 mg | ORAL_TABLET | ORAL | 0 refills | Status: DC | PRN

## 2024-05-20 MED ORDER — METOPROLOL SUCCINATE ER 25 MG PO TB24: 12.5000 mg | ORAL_TABLET | Freq: Every day | ORAL | 3 refills | Status: AC

## 2024-05-20 MED ORDER — ONDANSETRON 4 MG PO TBDP: 4.0000 mg | ORAL_TABLET | Freq: Three times a day (TID) | ORAL | 0 refills | Status: DC | PRN

## 2024-05-20 MED ORDER — LISINOPRIL 5 MG PO TABS: 5.0000 mg | ORAL_TABLET | Freq: Every day | ORAL | 3 refills | Status: AC

## 2024-05-20 NOTE — Progress Notes (Signed)
 Assessment & Plan   Assessment/Plan:     Assessment & Plan Chronic Headaches   He experiences chronic headaches with migraine characteristics, including photophobia and throbbing pain, occurring daily and possibly worsened by overuse of aspirin-containing powders. A CT scan in December was normal. He reports vision changes and fatigue but no nausea. A recent fall was attributed to headache severity. Sumatriptan is considered for migraine relief, and metoprolol for prophylaxis, as beta blockers may be beneficial. A headache journal is recommended to identify triggers. Due to the complexity, a neurology referral is advised. Prescribe sumatriptan 50 mg for migraine relief and metoprolol succinate 12.5 mg daily for prophylaxis. Advise reducing over-the-counter pain medication use and recommend lifestyle modifications, including adequate sleep, stress reduction, and hydration. Suggest an eye examination to rule out vision-related triggers.  Hypertension   Hypertension, diagnosed in 2019, is well-controlled with lisinopril  5 mg daily. Current blood pressure is 130/79 mmHg, heart rate 76 bpm, and oxygen saturation 97%. He has no symptoms of chest pain or shortness of breath. Previous echocardiogram and stress test were normal, showing only trivial tricuspid valvular regurgitation. Continue lisinopril  5 mg daily and monitor blood pressure regularly.  Follow-up   A follow-up is needed to monitor blood pressure and headache management, and to conduct baseline lab work. Blood pressure will be rechecked at the follow-up due to the lack of home monitoring capability. Schedule a follow-up appointment in one month and conduct fasting blood work at that time.      Medications Discontinued During This Encounter  Medication Reason   lidocaine  (XYLOCAINE ) 2 % solution Completed Course   ibuprofen  (ADVIL ) 800 MG tablet No longer needed (for PRN medications)   amoxicillin -clavulanate (AUGMENTIN ) 875-125 MG  tablet Completed Course   lisinopril  (ZESTRIL ) 10 MG tablet Reorder    Return in about 1 month (around 06/19/2024) for physical (fasting labs), BP, headaches.        Subjective:   Encounter date: 05/20/2024  [name] is a 43 y.o. male who has BRADYCARDIA and Hypertension on their problem list..   He  has a past medical history of Hypertension.[name]   He presents with chief complaint of Hypertension (Takes BP meds daily. No home Bps.) and Headache (Daily headaches. Takes Goodie powder.) .   Discussed the use of AI scribe software for clinical note transcription with the patient, who gave verbal consent to proceed.  History of Present Illness [name] is a 43 year old male with hypertension who presents with concerns about blood pressure and chronic headaches.  He has a history of hypertension, which became more active around 2019. He has been on lisinopril  since then, initially prescribed at 10 mg but he has been taking 5 mg daily by cutting the tablets in half. He had an echocardiogram and a stress test around 2009, which were normal except for trivial tricuspid valvular regurgitation. No chest pain or shortness of breath.  He has experienced headaches for as long as he can remember, which have worsened with age. The headaches are severe enough to stop his movement and are associated with photophobia. He uses over-the-counter medications like Advil  and Goody's powder almost daily for relief. The headaches sometimes occur when he is stressed or has not eaten. Approximately a month and a half ago, he experienced a severe headache that led to a fall, resulting in him hitting his head on the stove. A CT scan of his head was performed in December, which showed no significant abnormalities. No nausea but vision changes  and fatigue are associated with the headaches.  He was adopted and is unaware of his family medical history. He used to exercise but does not currently engage in regular  physical activity. No recent falls.    ROS  Past Surgical History:  Procedure Laterality Date   NO PAST SURGERIES      Outpatient Medications Prior to Visit  Medication Sig Dispense Refill   ibuprofen  (ADVIL ) 800 MG tablet Take 1 tablet (800 mg total) by mouth every 8 (eight) hours as needed for up to 21 doses. 21 tablet 0   lidocaine  (XYLOCAINE ) 2 % solution Swish and spit up as needed twice daily 50 mL 0   lisinopril  (ZESTRIL ) 10 MG tablet Take 1 tablet (10 mg total) by mouth daily. 30 tablet 1   amoxicillin -clavulanate (AUGMENTIN ) 875-125 MG tablet Take 1 tablet by mouth every 12 (twelve) hours. (Patient not taking: Reported on 05/20/2024) 14 tablet 0   No facility-administered medications prior to visit.    Family History  Adopted: Yes  Problem Relation Age of Onset   Healthy Mother     Social History   Socioeconomic History   Marital status: Single    Spouse name: Not on file   Number of children: Not on file   Years of education: Not on file   Highest education level: GED or equivalent  Occupational History   Not on file  Tobacco Use   Smoking status: Never   Smokeless tobacco: Never  Vaping Use   Vaping status: Never Used  Substance and Sexual Activity   Alcohol use: No   Drug use: No   Sexual activity: Not Currently    Birth control/protection: None  Other Topics Concern   Not on file  Social History Narrative   Not on file   Social Drivers of Health   Financial Resource Strain: Medium Risk (05/20/2024)   Overall Financial Resource Strain (CARDIA)    Difficulty of Paying Living Expenses: Somewhat hard  Food Insecurity: No Food Insecurity (05/20/2024)   Hunger Vital Sign    Worried About Running Out of Food in the Last Year: Never true    Ran Out of Food in the Last Year: Never true  Transportation Needs: Unknown (05/20/2024)   PRAPARE - Administrator, Civil Service (Medical): Not on file    Lack of Transportation (Non-Medical): No   Physical Activity: Sufficiently Active (05/20/2024)   Exercise Vital Sign    Days of Exercise per Week: 3 days    Minutes of Exercise per Session: 120 min  Stress: No Stress Concern Present (05/20/2024)   [name] of Occupational Health - Occupational Stress Questionnaire    Feeling of Stress : Only a little  Social Connections: Moderately Isolated (05/20/2024)   Social Connection and Isolation Panel [NHANES]    Frequency of Communication with Friends and Family: More than three times a week    Frequency of Social Gatherings with Friends and Family: Twice a week    Attends Religious Services: More than 4 times per year    Active Member of [employer] or Organizations: No    Attends Engineer, structural: Not on file    Marital Status: Divorced  Intimate Partner Violence: Not on file  Objective:  Physical Exam: BP 130/79 (BP Location: Right Arm, Patient Position: Sitting)   Pulse 76   Temp 97.9 F (36.6 C) (Temporal)   Ht 6\' 3"  (1.905 m)   Wt 197 lb 3.2 oz (89.4 kg)   SpO2 97%   BMI 24.65 kg/m    Physical Exam VITALS: P- 76, BP- 130/79, SaO2- 97% GENERAL: Alert, cooperative, well developed, no acute distress. HEENT: Normocephalic, normal oropharynx, moist mucous membranes. CHEST: Clear to auscultation bilaterally, no wheezes, rhonchi, or crackles. CARDIOVASCULAR: Normal heart rate and rhythm, S1 and S2 normal without murmurs. ABDOMEN: Soft, non-tender, non-distended, without organomegaly, normal bowel sounds. EXTREMITIES: No cyanosis or edema. NEUROLOGICAL: Cranial nerves grossly intact, moves all extremities without gross motor or sensory deficit.   Physical Exam  No results found.  Recent Results (from the past 2160 hours)  POCT urinalysis dipstick     Status: Abnormal   Collection Time: 04/07/24  4:32 PM  Result Value Ref Range   Color, UA yellow    Clarity,  UA cloudy (A)    Glucose, UA negative mg/dL   Bilirubin, UA negative    Ketones, POC UA negative mg/dL   Spec Grav, UA >=7.829 (A)    Blood, UA small (A)    pH, UA 6.0    Protein Ur, POC =30 (A) mg/dL   Urobilinogen, UA 0.2 E.U./dL   Nitrite, UA Negative    Leukocytes, UA Small (1+) (A)   Urine Culture     Status: None   Collection Time: 04/07/24  5:04 PM   Specimen: Urine, Clean Catch  Result Value Ref Range   Specimen Description URINE, CLEAN CATCH    Special Requests Normal    Culture      NO GROWTH Performed at [facility], [address] [place], [place] [address]    Report Status 04/08/2024 FINAL   POCT CBG (manual entry)     Status: Abnormal   Collection Time: 04/07/24  5:08 PM  Result Value Ref Range   POCT Glucose ([facility]) 114 (A) 70 - 99 mg/dL        Carnell Christian, MD, MS

## 2024-05-20 NOTE — Patient Instructions (Signed)
  VISIT SUMMARY: Today, we discussed your concerns about blood pressure and chronic headaches. Your blood pressure is well-controlled with your current medication, and we have developed a plan to better manage your headaches.  YOUR PLAN: -CHRONIC HEADACHES: Chronic headaches are persistent headaches that occur frequently and can be severe. Your headaches have characteristics of migraines, including sensitivity to light and throbbing pain. We will start you on sumatriptan 50 mg for immediate relief and metoprolol succinate 12.5 mg daily to help prevent headaches. It's important to reduce the use of over-the-counter pain medications, as they may worsen your headaches. Keeping a headache journal can help identify triggers. We also recommend an eye examination to rule out vision-related causes and will refer you to a neurologist for further evaluation.  -HYPERTENSION: Hypertension, or high blood pressure, is a condition where the force of the blood against your artery walls is too high. Your blood pressure is currently well-controlled with lisinopril  5 mg daily. Continue taking your medication as prescribed and monitor your blood pressure regularly.  INSTRUCTIONS: Please schedule a follow-up appointment in one month to monitor your blood pressure and headache management. At that time, we will also conduct fasting blood work. Make sure to keep a headache journal and reduce the use of over-the-counter pain medications. Additionally, schedule an eye examination to rule out vision-related triggers for your headaches.

## 2024-06-19 ENCOUNTER — Ambulatory Visit: Admitting: Family Medicine

## 2024-06-25 ENCOUNTER — Ambulatory Visit: Admitting: Family Medicine

## 2024-07-03 ENCOUNTER — Telehealth: Payer: Self-pay | Admitting: Family Medicine

## 2024-07-03 NOTE — Telephone Encounter (Signed)
 06/19/2024 no show 06/25/2024 no show  Final warning sent via mail and mychart

## 2024-07-04 ENCOUNTER — Ambulatory Visit: Payer: Self-pay

## 2024-07-07 ENCOUNTER — Ambulatory Visit
Admission: RE | Admit: 2024-07-07 | Discharge: 2024-07-07 | Disposition: A | Discharge status: Home / Self Care | Payer: Self-pay | Source: Ambulatory Visit | Attending: Physician Assistant | Admitting: Physician Assistant

## 2024-07-07 VITALS — BP 144/71 | HR 54 | Temp 98.4°F | Resp 18 | Ht 75.0 in | Wt 200.0 lb

## 2024-07-07 DIAGNOSIS — K047 Periapical abscess without sinus: Secondary | ICD-10-CM | POA: Diagnosis not present

## 2024-07-07 MED ORDER — AMOXICILLIN 500 MG PO CAPS: 500.0000 mg | ORAL_CAPSULE | Freq: Two times a day (BID) | ORAL | 0 refills | Status: AC

## 2024-07-07 NOTE — ED Triage Notes (Signed)
 In my tooth - Entered by patient  I have a dental abscess in my mouth, bottom left side, first noticed pain yesterday. No fever. I have a dentist to contact.

## 2024-07-07 NOTE — ED Provider Notes (Signed)
 [name]    CSN: [id number] Arrival date & time: 07/07/24  1742      History   Chief Complaint Chief Complaint  Patient presents with  . Abscess    HPI [name] is a 43 y.o. male.    Abscess   Past Medical History:  Diagnosis Date  . Hypertension     Patient Active Problem List   Diagnosis Date Noted  . Hypertension   . BRADYCARDIA 01/18/2009    Past Surgical History:  Procedure Laterality Date  . NO PAST SURGERIES         Home Medications    Prior to Admission medications   Medication Sig Start Date End Date Taking? Authorizing Provider  lisinopril  (ZESTRIL ) 5 MG tablet Take 1 tablet (5 mg total) by mouth daily. 05/20/24 05/15/25 Yes Sebastian Beverley NOVAK, MD  metoprolol  succinate (TOPROL -XL) 25 MG 24 hr tablet Take 0.5 tablets (12.5 mg total) by mouth daily. 05/20/24   Sebastian Beverley NOVAK, MD  ondansetron  (ZOFRAN -ODT) 4 MG disintegrating tablet Take 1 tablet (4 mg total) by mouth every 8 (eight) hours as needed for nausea or vomiting. 05/20/24   Sebastian Beverley NOVAK, MD  SUMAtriptan  (IMITREX ) 50 MG tablet Take 1 tablet (50 mg total) by mouth every 2 (two) hours as needed for migraine. May repeat in 2 hours if headache persists or recurs. 05/20/24   Sebastian Beverley NOVAK, MD    Family History Family History  Adopted: Yes  Problem Relation Age of Onset  . Healthy Mother     Social History Social History   Tobacco Use  . Smoking status: Never  . Smokeless tobacco: Never  Vaping Use  . Vaping status: Never Used  Substance Use Topics  . Alcohol use: No  . Drug use: No     Allergies   Patient has no known allergies.   Review of Systems Review of Systems   Physical Exam Triage Vital Signs ED Triage Vitals  Encounter Vitals Group     BP 07/07/24 1756 (!) 144/71     Girls Systolic BP Percentile --      Girls Diastolic BP Percentile --      Boys Systolic BP Percentile --      Boys Diastolic BP Percentile --      Pulse Rate 07/07/24  1756 (!) 54     Resp 07/07/24 1756 18     Temp 07/07/24 1756 98.4 F (36.9 C)     Temp Source 07/07/24 1756 Oral     SpO2 07/07/24 1756 96 %     Weight 07/07/24 1754 200 lb (90.7 kg)     Height 07/07/24 1754 6' 3 (1.905 m)     Head Circumference --      Peak Flow --      Pain Score 07/07/24 1752 8     Pain Loc --      Pain Education --      Exclude from Growth Chart --    No data found.  Updated Vital Signs BP (!) 144/71 (BP Location: Left Arm)   Pulse (!) 54   Temp 98.4 F (36.9 C) (Oral)   Resp 18   Ht 6' 3 (1.905 m)   Wt 200 lb (90.7 kg)   SpO2 96%   BMI 25.00 kg/m   Visual Acuity Right Eye Distance:   Left Eye Distance:   Bilateral Distance:    Right Eye Near:   Left Eye Near:    Bilateral Near:  Physical Exam   UC Treatments / Results  Labs (all labs ordered are listed, but only abnormal results are displayed) Labs Reviewed - No data to display  EKG   Radiology No results found.  Procedures Procedures (including critical care time)  Medications Ordered in UC Medications - No data to display  Initial Impression / Assessment and Plan / UC Course  I have reviewed the triage vital signs and the nursing notes.  Pertinent labs & imaging results that were available during my care of the patient were reviewed by me and considered in my medical decision making (see chart for details).     *** Final Clinical Impressions(s) / UC Diagnoses   Final diagnoses:  None   Discharge Instructions   None    ED Prescriptions   None    PDMP not reviewed this encounter.

## 2024-09-16 ENCOUNTER — Ambulatory Visit: Admission: EM | Admit: 2024-09-16 | Discharge: 2024-09-16 | Disposition: A | Discharge status: Home / Self Care

## 2024-09-16 DIAGNOSIS — J028 Acute pharyngitis due to other specified organisms: Secondary | ICD-10-CM

## 2024-09-16 LAB — POCT RAPID STREP A (OFFICE): Rapid Strep A Screen: NEGATIVE

## 2024-09-16 MED ORDER — PREDNISONE 20 MG PO TABS: 40.0000 mg | ORAL_TABLET | Freq: Every day | ORAL | 0 refills | Status: AC

## 2024-09-16 MED ORDER — AZELASTINE HCL 0.1 % NA SOLN: 1.0000 | Freq: Two times a day (BID) | NASAL | 1 refills | Status: AC

## 2024-09-16 NOTE — Discharge Instructions (Addendum)
  1. Acute pharyngitis due to other specified organisms (Primary) - POCT rapid strep A complete and UC is negative for strep pharyngitis - azelastine (ASTELIN) 0.1 % nasal spray; Place 1 spray into both nostrils 2 (two) times daily. Use in each nostril as directed  Dispense: 30 mL; Refill: 1 - predniSONE  (DELTASONE ) 20 MG tablet; Take 2 tablets (40 mg total) by mouth daily for 5 days.  Dispense: 10 tablet; Refill: 0

## 2024-09-16 NOTE — ED Provider Notes (Signed)
 [facility]  Note:  This document was prepared using Conservation officer, historic buildings and may include unintentional dictation errors.  MRN: [id number] DOB: [birth year]  Subjective:   [name] is a 43 y.o. male presenting for evaluation of sore throat that started yesterday evening.  Patient reports that he had significant exposure to strep pharyngitis at work.  Patient denies any improvement to symptoms since onset.  No fever, no rash, no shortness of breath, no chest pain, no cough, no body aches.  Patient was advised by his employer to follow-up in urgent care for evaluation and testing.  No current facility-administered medications for this encounter.  Current Outpatient Medications:    azelastine (ASTELIN) 0.1 % nasal spray, Place 1 spray into both nostrils 2 (two) times daily. Use in each nostril as directed, Disp: 30 mL, Rfl: 1   lisinopril  (ZESTRIL ) 5 MG tablet, Take 1 tablet (5 mg total) by mouth daily., Disp: 90 tablet, Rfl: 3   predniSONE  (DELTASONE ) 20 MG tablet, Take 2 tablets (40 mg total) by mouth daily for 5 days., Disp: 10 tablet, Rfl: 0   SUMAtriptan  (IMITREX ) 50 MG tablet, Take 1 tablet (50 mg total) by mouth every 2 (two) hours as needed for migraine. May repeat in 2 hours if headache persists or recurs., Disp: 10 tablet, Rfl: 0   metoprolol  succinate (TOPROL -XL) 25 MG 24 hr tablet, Take 0.5 tablets (12.5 mg total) by mouth daily., Disp: 45 tablet, Rfl: 3   ondansetron  (ZOFRAN -ODT) 4 MG disintegrating tablet, Take 1 tablet (4 mg total) by mouth every 8 (eight) hours as needed for nausea or vomiting., Disp: 20 tablet, Rfl: 0   No Known Allergies  Past Medical History:  Diagnosis Date   Hypertension      Past Surgical History:  Procedure Laterality Date   NO PAST SURGERIES      Family History  Adopted: Yes  Problem Relation Age of Onset   Healthy Mother     Social History   Tobacco Use   Smoking status: Never   Smokeless tobacco: Never  Vaping  Use   Vaping status: Never Used  Substance Use Topics   Alcohol use: No   Drug use: No    ROS Refer to HPI for ROS details.  Objective:   Vitals: BP (!) 154/99 (BP Location: Left Arm)   Pulse (!) 53 Comment: my normal  Temp 98.2 F (36.8 C) (Oral)   Resp 16   Ht 6' 3 (1.905 m)   Wt 180 lb (81.6 kg)   SpO2 97%   BMI 22.50 kg/m   Physical Exam Vitals and nursing note reviewed.  Constitutional:      General: He is not in acute distress.    Appearance: He is well-developed. He is not ill-appearing or toxic-appearing.  HENT:     Head: Normocephalic.     Nose: Nose normal. No congestion or rhinorrhea.     Mouth/Throat:     Mouth: Mucous membranes are moist.     Pharynx: Oropharynx is clear. Posterior oropharyngeal erythema present. No oropharyngeal exudate.  Eyes:     Extraocular Movements: Extraocular movements intact.     Conjunctiva/sclera: Conjunctivae normal.  Cardiovascular:     Rate and Rhythm: Normal rate.  Pulmonary:     Effort: Pulmonary effort is normal. No respiratory distress.     Breath sounds: No stridor. No wheezing.  Skin:    General: Skin is warm and dry.  Neurological:     General: No focal deficit  present.     Mental Status: He is alert and oriented to person, place, and time.  Psychiatric:        Mood and Affect: Mood normal.        Behavior: Behavior normal.     Procedures  Results for orders placed or performed during the hospital encounter of 09/16/24 (from the past 24 hours)  POCT rapid strep A     Status: Normal   Collection Time: 09/16/24  3:01 PM  Result Value Ref Range   Rapid Strep A Screen Negative Negative    No results found.   Assessment and Plan :     Discharge Instructions       1. Acute pharyngitis due to other specified organisms (Primary) - POCT rapid strep A complete and UC is negative for strep pharyngitis - azelastine (ASTELIN) 0.1 % nasal spray; Place 1 spray into both nostrils 2 (two) times daily. Use  in each nostril as directed  Dispense: 30 mL; Refill: 1 - predniSONE  (DELTASONE ) 20 MG tablet; Take 2 tablets (40 mg total) by mouth daily for 5 days.  Dispense: 10 tablet; Refill: 0       Ethel KATHEE Aurea Aurea, [place] B, [place] 09/16/24 1526

## 2024-09-16 NOTE — ED Triage Notes (Signed)
 Patient reports significant exposure to strep throat at work and developed a sore throat starting yesterday evening, which has persisted. Denies fever or rash. Presented today at the recommendation of employer to be evaluated.
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
## 2024-09-30 ENCOUNTER — Ambulatory Visit (INDEPENDENT_AMBULATORY_CARE_PROVIDER_SITE_OTHER): Admitting: Neurology

## 2024-09-30 ENCOUNTER — Other Ambulatory Visit: Payer: Self-pay | Admitting: Neurology

## 2024-09-30 ENCOUNTER — Encounter: Payer: Self-pay | Admitting: Neurology

## 2024-09-30 VITALS — BP 150/90 | HR 56 | Ht 75.0 in | Wt 194.0 lb

## 2024-09-30 DIAGNOSIS — G4719 Other hypersomnia: Secondary | ICD-10-CM | POA: Diagnosis not present

## 2024-09-30 DIAGNOSIS — G43009 Migraine without aura, not intractable, without status migrainosus: Secondary | ICD-10-CM | POA: Diagnosis not present

## 2024-09-30 DIAGNOSIS — G43E09 Chronic migraine with aura, not intractable, without status migrainosus: Secondary | ICD-10-CM | POA: Diagnosis not present

## 2024-09-30 MED ORDER — RIZATRIPTAN BENZOATE 10 MG PO TBDP: 10.0000 mg | ORAL_TABLET | ORAL | 5 refills | Status: DC | PRN

## 2024-09-30 MED ORDER — ONDANSETRON 4 MG PO TBDP: 4.0000 mg | ORAL_TABLET | Freq: Three times a day (TID) | ORAL | 5 refills | Status: AC | PRN

## 2024-09-30 MED ORDER — TOPIRAMATE 25 MG PO TABS: 25.0000 mg | ORAL_TABLET | Freq: Every day | ORAL | 5 refills | Status: AC

## 2024-09-30 MED ORDER — ELETRIPTAN HYDROBROMIDE 40 MG PO TABS: 40.0000 mg | ORAL_TABLET | ORAL | 5 refills | Status: AC | PRN

## 2024-09-30 NOTE — Patient Instructions (Addendum)
  Refer to sleep medicine for evaluation of sleep apnea. Start TOPIRAMATE 25MG  AT BEDTIME.  Contact us  in 4 weeks with update and we can increase dose if needed. Take RIZATRIPTAN at earliest onset of headache.  May repeat dose once in 2 hours if needed.  Maximum 2 tablets in 24 hours. Take ONDANSETRON  for nausea STOP GOODYS.  Rule of thumb:  Limit use of pain relievers to no more than 9 days out of the month.  These medications include acetaminophen , NSAIDs (ibuprofen /Advil /Motrin , naproxen /Aleve , triptans (Imitrex /sumatriptan ), Excedrin, and narcotics.  This will help reduce risk of rebound headaches. Be aware of common food triggers.  STOP PEPSI Routine exercise Stay adequately hydrated (aim for 64 oz water daily) Keep headache diary Maintain proper stress management Maintain proper sleep hygiene Do not skip meals Consider supplements:  magnesium citrate 400mg  daily, riboflavin 400mg  daily, coenzyme Q10 100mg  three times daily.

## 2024-10-01 ENCOUNTER — Encounter: Payer: Self-pay | Admitting: Neurology

## 2024-10-27 ENCOUNTER — Ambulatory Visit: Admitting: Primary Care

## 2024-10-30 ENCOUNTER — Encounter: Payer: Self-pay | Admitting: Neurology

## 2024-11-03 ENCOUNTER — Ambulatory Visit: Admitting: Primary Care

## 2024-12-23 ENCOUNTER — Ambulatory Visit: Admitting: Family Medicine

## 2024-12-23 NOTE — Progress Notes (Incomplete)
 " Assessment & Plan   Assessment/Plan:    Problem List Items Addressed This Visit   None       Assessment and Plan               There are no discontinued medications.  No follow-ups on file.        Subjective:   Encounter date: 12/23/2024  [name] is a 44 y.o. male who has BRADYCARDIA and Hypertension on their problem list..   He  has a past medical history of Hypertension.[name]   He presents with chief complaint of No chief complaint on file. .   Discussed the use of AI scribe software for clinical note transcription with the patient, who gave verbal consent to proceed.  History of Present Illness           Pain During Bowel Movements      Hypertension - Managed with Lisinopril  5 mg daily. - Last blood pressure was 152/95 and 150/90 mmHg in October     ROS  Past Surgical History:  Procedure Laterality Date   NO PAST SURGERIES      Medications Ordered Prior to Encounter[1]  Family History  Adopted: Yes  Problem Relation Age of Onset   Healthy Mother     Social History   Socioeconomic History   Marital status: Single    Spouse name: Not on file   Number of children: Not on file   Years of education: Not on file   Highest education level: GED or equivalent  Occupational History   Not on file  Tobacco Use   Smoking status: Never   Smokeless tobacco: Never  Vaping Use   Vaping status: Never Used  Substance and Sexual Activity   Alcohol use: No   Drug use: No   Sexual activity: Not Currently    Birth control/protection: None  Other Topics Concern   Not on file  Social History Narrative   Left handed   Social Drivers of Erickson   Tobacco Use: Low Risk (09/30/2024)   Patient History    Smoking Tobacco Use: Never    Smokeless Tobacco Use: Never    Passive Exposure: Not on file  Financial Resource Strain: Medium Risk (05/20/2024)   Overall Financial Resource Strain (CARDIA)    Difficulty of Paying Living Expenses:  Somewhat hard  Food Insecurity: No Food Insecurity (05/20/2024)   Hunger Vital Sign    Worried About Running Out of Food in the Last Year: Never true    Ran Out of Food in the Last Year: Never true  Transportation Needs: Unknown (05/20/2024)   PRAPARE - Administrator, Civil Service (Medical): Not on file    Lack of Transportation (Non-Medical): No  Physical Activity: Sufficiently Active (05/20/2024)   Exercise Vital Sign    Days of Exercise per Week: 3 days    Minutes of Exercise per Session: 120 min  Stress: No Stress Concern Present (05/20/2024)   [name] - Occupational Stress Questionnaire    Feeling of Stress : Only a little  Social Connections: Moderately Isolated (05/20/2024)   Social Connection and Isolation Panel    Frequency of Communication with Friends and Family: More than three times a week    Frequency of Social Gatherings with Friends and Family: Twice a week    Attends Religious Services: More than 4 times per year    Active Member of [employer] or Organizations: No    Attends Banker  Meetings: Not on file    Marital Status: Divorced  Intimate Partner Violence: Not on file  Depression [phone]): Low Risk (05/20/2024)   Depression (PHQ2-9)    PHQ-2 Score: 4  Alcohol Screen: Low Risk (05/20/2024)   Alcohol Screen    Last Alcohol Screening Score (AUDIT): 0  Housing: Low Risk (05/20/2024)   Housing Stability Vital Sign    Unable to Pay for Housing in the Last Year: No    Number of Times Moved in the Last Year: 1    Homeless in the Last Year: No  Utilities: Not on file  [place] Literacy: Not on file                                                                                                  Objective:  Physical Exam: There were no vitals taken for this visit.   Physical Exam           Physical Exam  No results found.  No results found for this or any previous visit (from the past 2160 hours).       Beverley KATHEE Hummer, MD  I,Emily Lagle,acting as a scribe for Beverley KATHEE Hummer, MD.,have documented all relevant documentation on the behalf of Beverley KATHEE Hummer, MD.  LILLETTE Beverley KATHEE Hummer, MD, have reviewed all documentation for this visit. The documentation on 12/23/2024 for the exam, diagnosis, procedures, and orders are all accurate and complete.     [1]  Current Outpatient Medications on File Prior to Visit  Medication Sig Dispense Refill   azelastine  (ASTELIN ) 0.1 % nasal spray Place 1 spray into both nostrils 2 (two) times daily. Use in each nostril as directed 30 mL 1   eletriptan  (RELPAX ) 40 MG tablet Take 1 tablet (40 mg total) by mouth as needed for migraine or headache. May repeat in 2 hours if headache persists or recurs.  Maximum 2 tablets in 24 hours. 10 tablet 5   lisinopril  (ZESTRIL ) 5 MG tablet Take 1 tablet (5 mg total) by mouth daily. 90 tablet 3   metoprolol  succinate (TOPROL -XL) 25 MG 24 hr tablet Take 0.5 tablets (12.5 mg total) by mouth daily. 45 tablet 3   ondansetron  (ZOFRAN -ODT) 4 MG disintegrating tablet Take 1 tablet (4 mg total) by mouth every 8 (eight) hours as needed for nausea or vomiting. 20 tablet 5   topiramate  (TOPAMAX ) 25 MG tablet Take 1 tablet (25 mg total) by mouth at bedtime. 30 tablet 5   No current facility-administered medications on file prior to visit.   "

## 2025-01-30 ENCOUNTER — Ambulatory Visit (HOSPITAL_BASED_OUTPATIENT_CLINIC_OR_DEPARTMENT_OTHER): Admitting: Pulmonary Disease

## 2025-04-27 ENCOUNTER — Ambulatory Visit: Admitting: Neurology

## 2025-05-05 ENCOUNTER — Ambulatory Visit: Admitting: Neurology
